# Patient Record
Sex: Female | Born: 1950 | Race: White | Hispanic: No | Marital: Married | Smoking: Never smoker
Health system: Southern US, Community
[De-identification: ages and names within clinical notes are randomized; demographics above are authoritative.]

## PROBLEM LIST (undated history)

## (undated) DIAGNOSIS — Z9889 Other specified postprocedural states: Secondary | ICD-10-CM

## (undated) DIAGNOSIS — R112 Nausea with vomiting, unspecified: Secondary | ICD-10-CM

## (undated) DIAGNOSIS — I10 Essential (primary) hypertension: Secondary | ICD-10-CM

## (undated) DIAGNOSIS — G971 Other reaction to spinal and lumbar puncture: Secondary | ICD-10-CM

## (undated) DIAGNOSIS — R011 Cardiac murmur, unspecified: Secondary | ICD-10-CM

## (undated) DIAGNOSIS — M2550 Pain in unspecified joint: Secondary | ICD-10-CM

## (undated) DIAGNOSIS — K5790 Diverticulosis of intestine, part unspecified, without perforation or abscess without bleeding: Secondary | ICD-10-CM

## (undated) DIAGNOSIS — M2540 Effusion, unspecified joint: Secondary | ICD-10-CM

## (undated) DIAGNOSIS — D649 Anemia, unspecified: Secondary | ICD-10-CM

## (undated) DIAGNOSIS — C801 Malignant (primary) neoplasm, unspecified: Secondary | ICD-10-CM

## (undated) DIAGNOSIS — L409 Psoriasis, unspecified: Secondary | ICD-10-CM

## (undated) DIAGNOSIS — E785 Hyperlipidemia, unspecified: Secondary | ICD-10-CM

## (undated) DIAGNOSIS — R350 Frequency of micturition: Secondary | ICD-10-CM

## (undated) DIAGNOSIS — Z8709 Personal history of other diseases of the respiratory system: Secondary | ICD-10-CM

## (undated) DIAGNOSIS — E039 Hypothyroidism, unspecified: Secondary | ICD-10-CM

## (undated) HISTORY — PX: NEPHRECTOMY: SHX65

## (undated) HISTORY — PX: OTHER SURGICAL HISTORY: SHX169

## (undated) HISTORY — PX: COLONOSCOPY: SHX174

---

## 1998-07-30 ENCOUNTER — Other Ambulatory Visit: Admission: RE | Admit: 1998-07-30 | Discharge: 1998-07-30 | Payer: Self-pay | Admitting: Gynecology

## 1999-07-23 ENCOUNTER — Encounter: Admission: RE | Admit: 1999-07-23 | Discharge: 1999-07-23 | Payer: Self-pay | Admitting: Internal Medicine

## 1999-07-23 ENCOUNTER — Encounter: Payer: Self-pay | Admitting: Internal Medicine

## 1999-07-30 ENCOUNTER — Encounter: Admission: RE | Admit: 1999-07-30 | Discharge: 1999-07-30 | Payer: Self-pay | Admitting: Internal Medicine

## 1999-07-30 ENCOUNTER — Encounter: Payer: Self-pay | Admitting: Internal Medicine

## 1999-09-30 ENCOUNTER — Other Ambulatory Visit: Admission: RE | Admit: 1999-09-30 | Discharge: 1999-09-30 | Payer: Self-pay | Admitting: Gynecology

## 2000-07-06 ENCOUNTER — Ambulatory Visit (HOSPITAL_COMMUNITY): Admission: RE | Admit: 2000-07-06 | Discharge: 2000-07-06 | Payer: Self-pay | Admitting: Gastroenterology

## 2000-07-06 ENCOUNTER — Encounter: Payer: Self-pay | Admitting: Gastroenterology

## 2000-07-16 ENCOUNTER — Encounter: Payer: Self-pay | Admitting: Internal Medicine

## 2000-07-16 ENCOUNTER — Encounter: Admission: RE | Admit: 2000-07-16 | Discharge: 2000-07-16 | Payer: Self-pay | Admitting: Internal Medicine

## 2000-08-02 ENCOUNTER — Encounter: Admission: RE | Admit: 2000-08-02 | Discharge: 2000-08-02 | Payer: Self-pay | Admitting: Internal Medicine

## 2000-08-02 ENCOUNTER — Encounter: Payer: Self-pay | Admitting: Internal Medicine

## 2000-09-30 ENCOUNTER — Other Ambulatory Visit: Admission: RE | Admit: 2000-09-30 | Discharge: 2000-09-30 | Payer: Self-pay | Admitting: Gynecology

## 2001-08-08 ENCOUNTER — Encounter: Payer: Self-pay | Admitting: Internal Medicine

## 2001-08-08 ENCOUNTER — Encounter: Admission: RE | Admit: 2001-08-08 | Discharge: 2001-08-08 | Payer: Self-pay | Admitting: Internal Medicine

## 2002-08-28 ENCOUNTER — Encounter: Admission: RE | Admit: 2002-08-28 | Discharge: 2002-08-28 | Payer: Self-pay | Admitting: Internal Medicine

## 2002-08-28 ENCOUNTER — Encounter: Payer: Self-pay | Admitting: Internal Medicine

## 2003-09-10 ENCOUNTER — Encounter: Admission: RE | Admit: 2003-09-10 | Discharge: 2003-09-10 | Payer: Self-pay | Admitting: Internal Medicine

## 2004-09-17 ENCOUNTER — Encounter: Admission: RE | Admit: 2004-09-17 | Discharge: 2004-09-17 | Payer: Self-pay | Admitting: Internal Medicine

## 2004-09-25 ENCOUNTER — Encounter: Admission: RE | Admit: 2004-09-25 | Discharge: 2004-09-25 | Payer: Self-pay | Admitting: Internal Medicine

## 2005-09-21 ENCOUNTER — Encounter: Admission: RE | Admit: 2005-09-21 | Discharge: 2005-09-21 | Payer: Self-pay | Admitting: Internal Medicine

## 2005-09-28 ENCOUNTER — Encounter: Admission: RE | Admit: 2005-09-28 | Discharge: 2005-09-28 | Payer: Self-pay | Admitting: Internal Medicine

## 2005-10-02 ENCOUNTER — Encounter: Admission: RE | Admit: 2005-10-02 | Discharge: 2005-10-02 | Payer: Self-pay | Admitting: Internal Medicine

## 2005-10-04 ENCOUNTER — Encounter: Admission: RE | Admit: 2005-10-04 | Discharge: 2005-10-04 | Payer: Self-pay | Admitting: Internal Medicine

## 2005-10-26 ENCOUNTER — Inpatient Hospital Stay (HOSPITAL_COMMUNITY): Admission: RE | Admit: 2005-10-26 | Discharge: 2005-10-28 | Payer: Self-pay | Admitting: Urology

## 2005-10-26 ENCOUNTER — Encounter (INDEPENDENT_AMBULATORY_CARE_PROVIDER_SITE_OTHER): Payer: Self-pay | Admitting: *Deleted

## 2006-02-05 ENCOUNTER — Ambulatory Visit (HOSPITAL_COMMUNITY): Admission: RE | Admit: 2006-02-05 | Discharge: 2006-02-05 | Payer: Self-pay | Admitting: Urology

## 2006-08-02 ENCOUNTER — Ambulatory Visit (HOSPITAL_COMMUNITY): Admission: RE | Admit: 2006-08-02 | Discharge: 2006-08-02 | Payer: Self-pay | Admitting: Urology

## 2006-08-06 ENCOUNTER — Ambulatory Visit (HOSPITAL_COMMUNITY): Admission: RE | Admit: 2006-08-06 | Discharge: 2006-08-06 | Payer: Self-pay | Admitting: Urology

## 2006-08-13 ENCOUNTER — Ambulatory Visit (HOSPITAL_COMMUNITY): Admission: RE | Admit: 2006-08-13 | Discharge: 2006-08-13 | Payer: Self-pay | Admitting: Urology

## 2006-09-03 ENCOUNTER — Other Ambulatory Visit: Admission: RE | Admit: 2006-09-03 | Discharge: 2006-09-03 | Payer: Self-pay | Admitting: Internal Medicine

## 2006-09-23 ENCOUNTER — Encounter: Admission: RE | Admit: 2006-09-23 | Discharge: 2006-09-23 | Payer: Self-pay | Admitting: Internal Medicine

## 2006-11-05 ENCOUNTER — Ambulatory Visit (HOSPITAL_COMMUNITY): Admission: RE | Admit: 2006-11-05 | Discharge: 2006-11-05 | Payer: Self-pay | Admitting: Urology

## 2007-01-31 ENCOUNTER — Ambulatory Visit (HOSPITAL_COMMUNITY): Admission: RE | Admit: 2007-01-31 | Discharge: 2007-01-31 | Payer: Self-pay | Admitting: Urology

## 2007-05-24 ENCOUNTER — Ambulatory Visit (HOSPITAL_COMMUNITY): Admission: RE | Admit: 2007-05-24 | Discharge: 2007-05-24 | Payer: Self-pay | Admitting: Urology

## 2007-09-22 ENCOUNTER — Ambulatory Visit (HOSPITAL_COMMUNITY): Admission: RE | Admit: 2007-09-22 | Discharge: 2007-09-22 | Payer: Self-pay | Admitting: Urology

## 2007-09-26 ENCOUNTER — Encounter: Admission: RE | Admit: 2007-09-26 | Discharge: 2007-09-26 | Payer: Self-pay | Admitting: Internal Medicine

## 2008-03-18 ENCOUNTER — Inpatient Hospital Stay (HOSPITAL_COMMUNITY): Admission: EM | Admit: 2008-03-18 | Discharge: 2008-03-19 | Payer: Self-pay | Admitting: Emergency Medicine

## 2008-03-18 ENCOUNTER — Ambulatory Visit: Payer: Self-pay | Admitting: Internal Medicine

## 2008-08-27 ENCOUNTER — Ambulatory Visit (HOSPITAL_COMMUNITY): Admission: RE | Admit: 2008-08-27 | Discharge: 2008-08-27 | Payer: Self-pay | Admitting: Urology

## 2008-09-10 IMAGING — CR DG CHEST 2V
2 series · 2 of 2 positions shown · non-contrast
Comparison: 02/05/06.

CLINICAL DATA: Renal cell carcinoma.
 CHEST - 2 VIEW:

[view not recorded (1 of 2)]
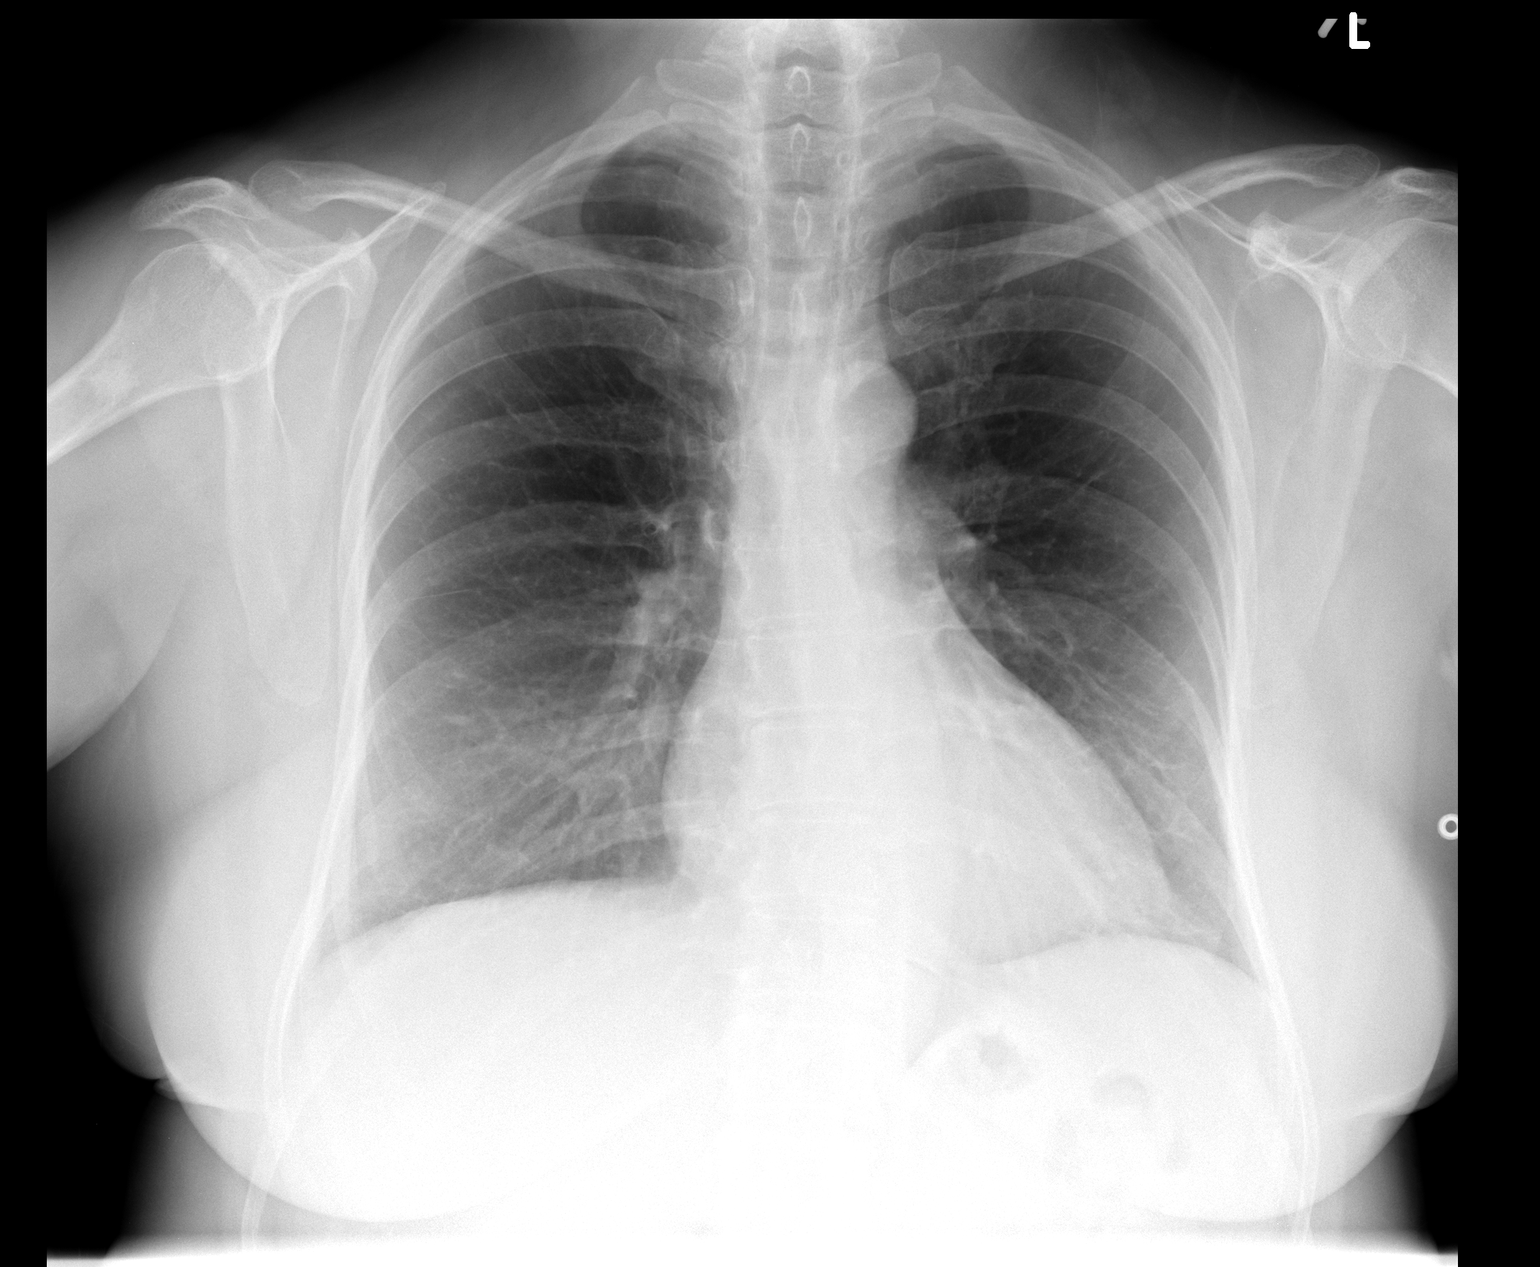

[view not recorded (2 of 2)]
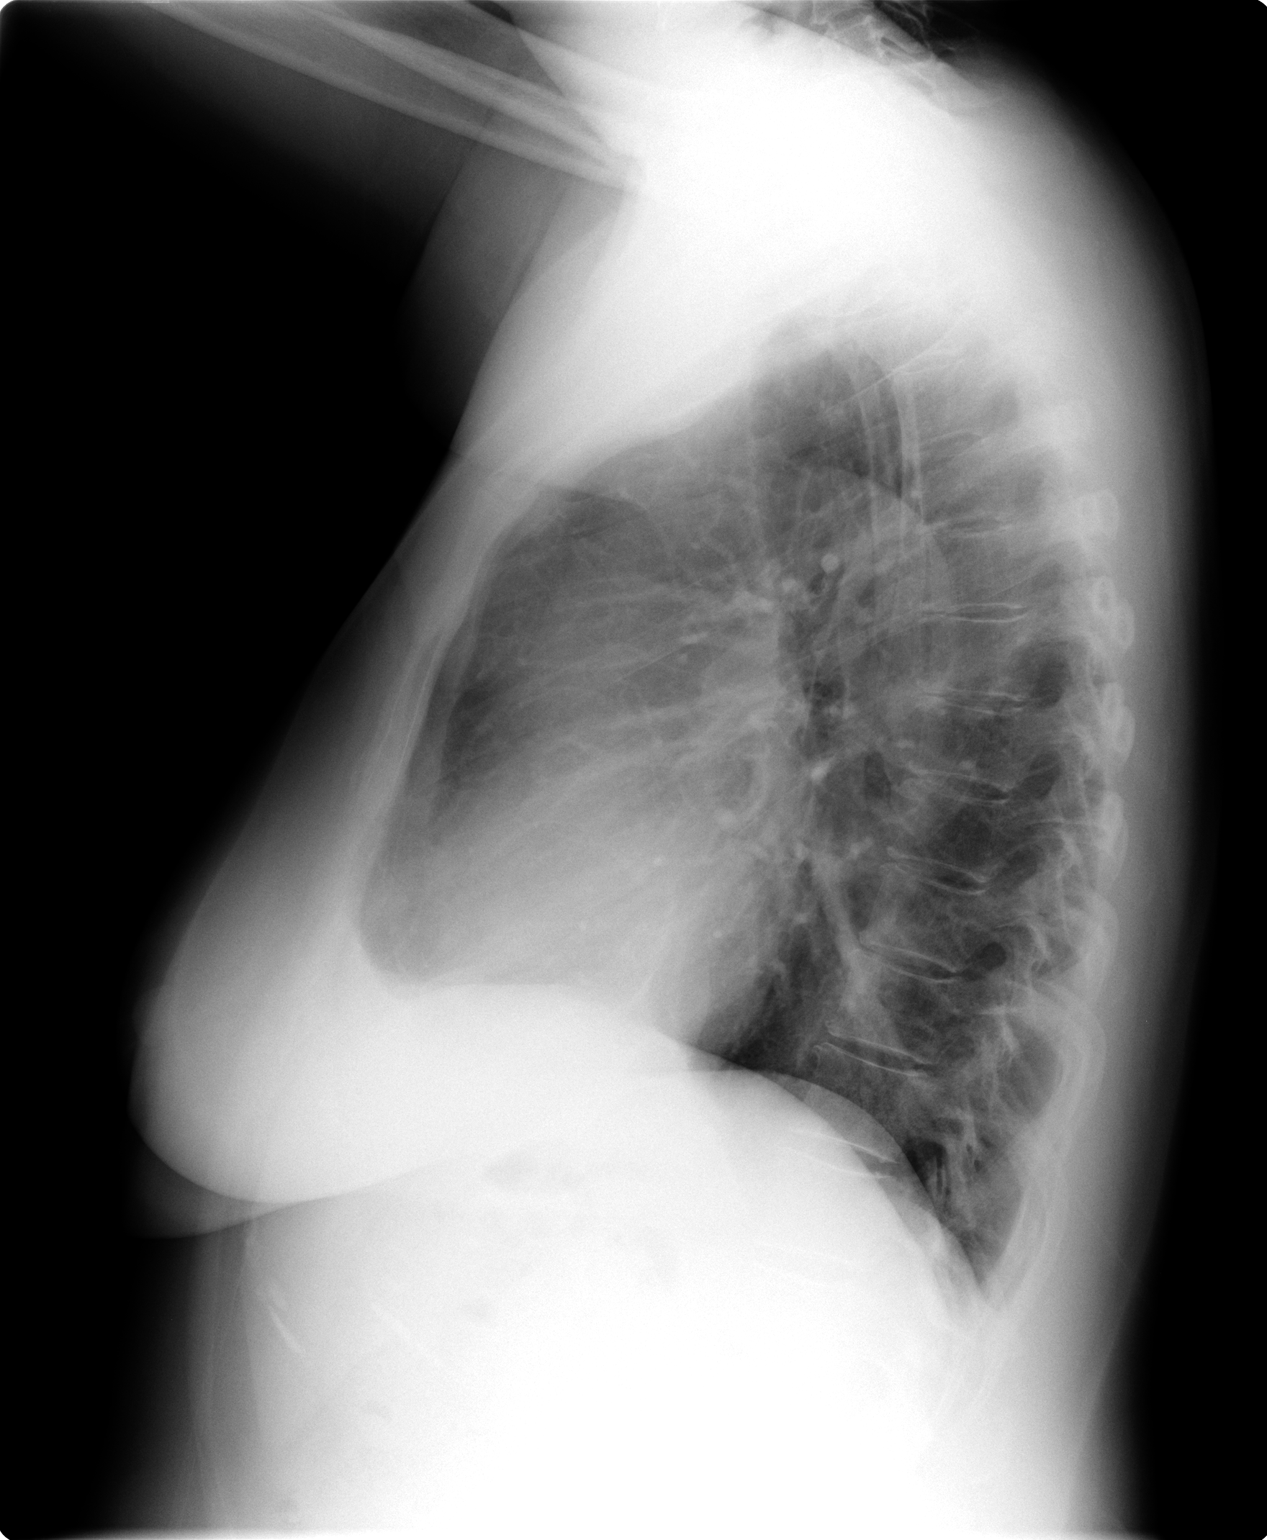

[2 of 2 positions shown; findings below may reference images not displayed]

FINDINGS: The lungs are clear without infiltrate or effusion. No lung nodules are identified. Heart is upper normal in size.
 There is a 1 cm sclerotic lesion in the right proximal humerus which was not imaged on prior studies.  This is probably benign but correlation with symptoms and possibly humerus radiographs is suggested.
IMPRESSION: 1.  Negative for lung nodule. No active cardiopulmonary disease.
 2.  Sclerotic lesion in the right humerus is probably benign but given the history, follow-up radiographs of the right humerus are suggested.

## 2008-09-26 ENCOUNTER — Encounter: Admission: RE | Admit: 2008-09-26 | Discharge: 2008-09-26 | Payer: Self-pay | Admitting: Internal Medicine

## 2009-03-11 ENCOUNTER — Ambulatory Visit (HOSPITAL_COMMUNITY): Admission: RE | Admit: 2009-03-11 | Discharge: 2009-03-11 | Payer: Self-pay | Admitting: Urology

## 2009-07-02 IMAGING — CR DG HUMERUS 2V *R*
2 series · 2 of 2 positions shown · non-contrast
Comparison: none

CLINICAL DATA: Renal cancer. Followup humeral lesion

RIGHT HUMERUS - 2 VIEW

[w humerus ap right *]
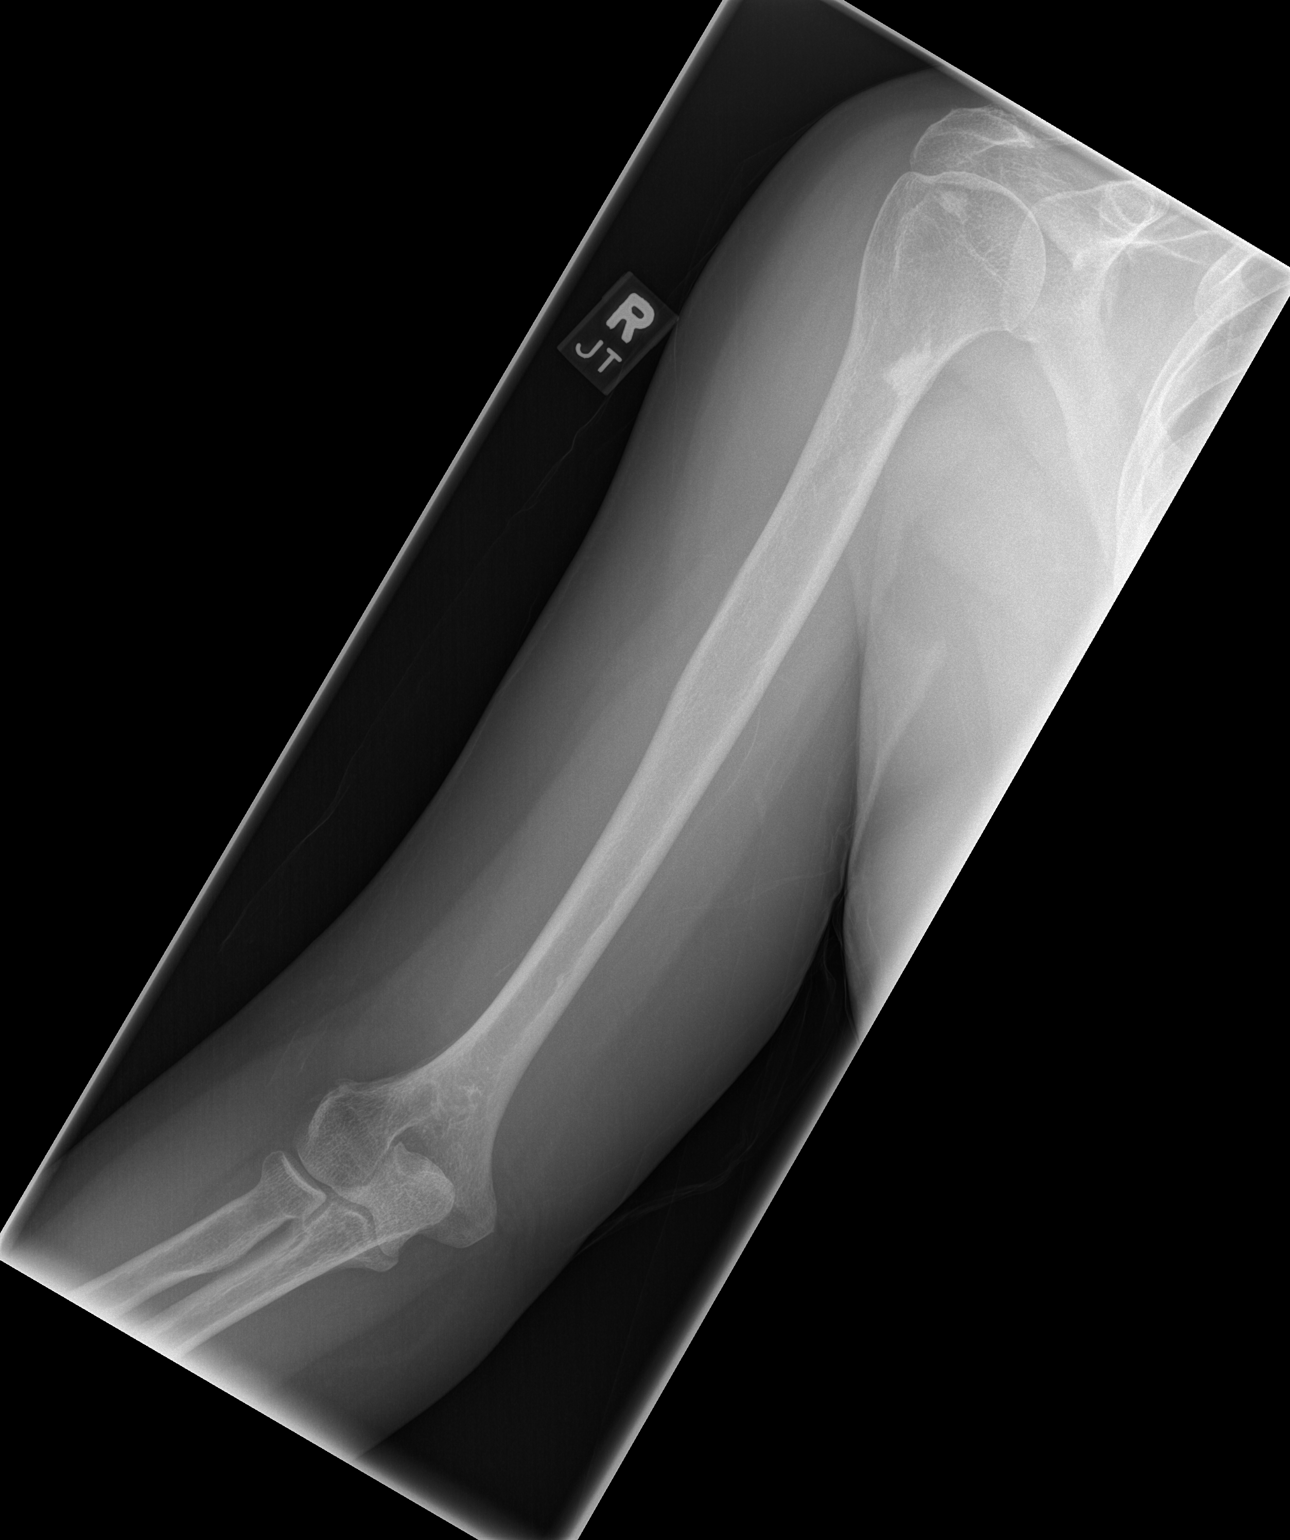

[w humerus lat right *]
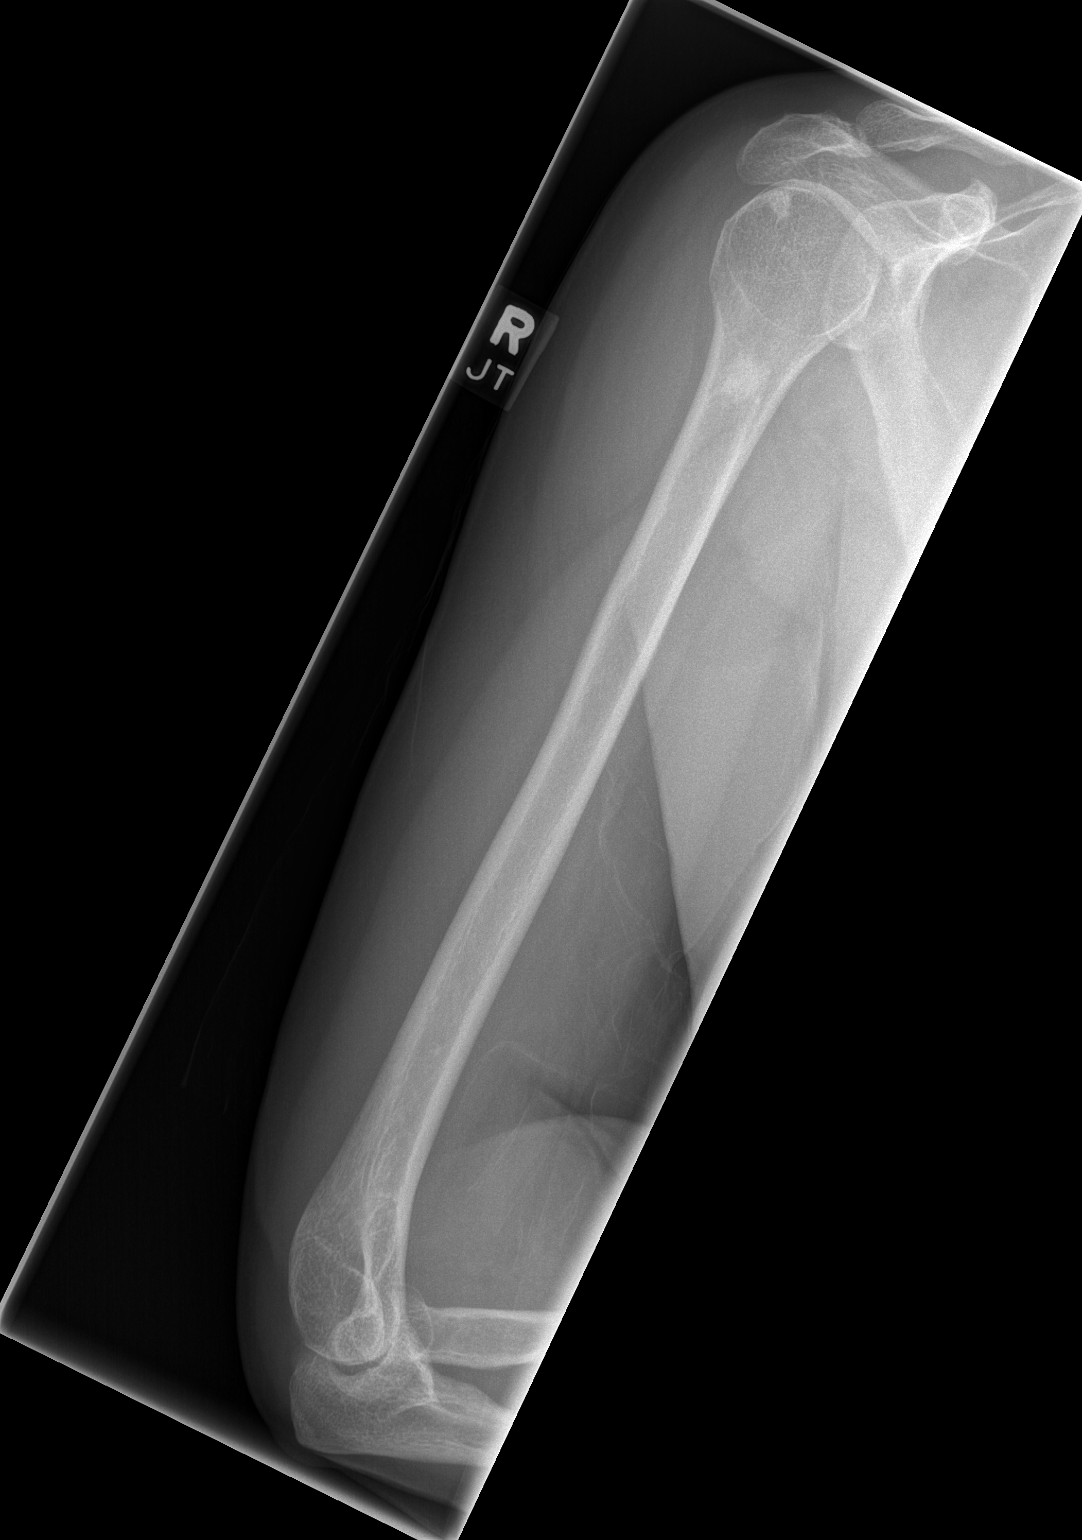

[2 of 2 positions shown; findings below may reference images not displayed]

FINDINGS: Two sclerotic foci are again seen within the proximal right humerus.
These are unchanged in size and appearance. These are also stable dating back to
08/06/2006, and were negative on bone scan on 08/13/2006. Given the stability and
prior negative bone scan, these are most likely bone islands.

IMPRESSION

Two sclerotic foci in the proximal right humerus are stable, most likely bone
islands.

## 2009-07-02 IMAGING — CR DG CHEST 2V
2 series · 2 of 2 positions shown · non-contrast
Comparison: 01/31/2007

CLINICAL DATA: Renal cancer

CHEST - 2 VIEW:

[view not recorded (1 of 2)]
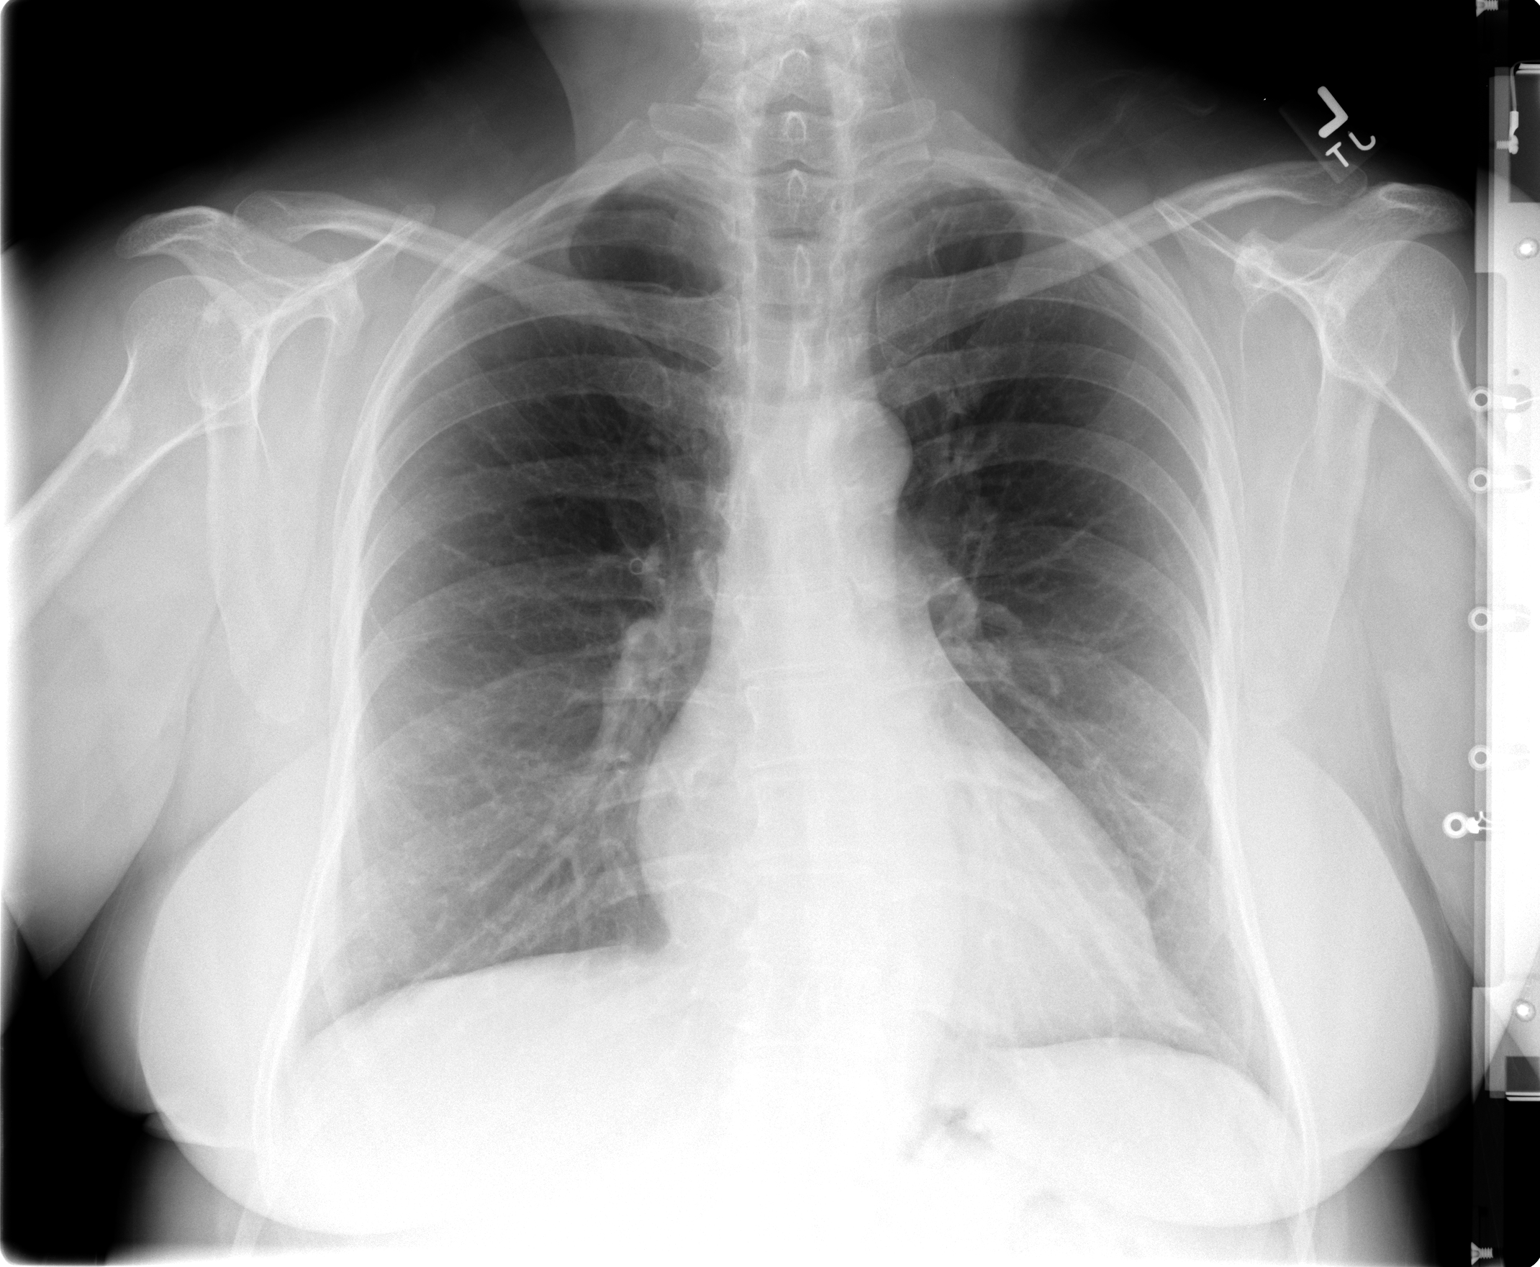

[view not recorded (2 of 2)]
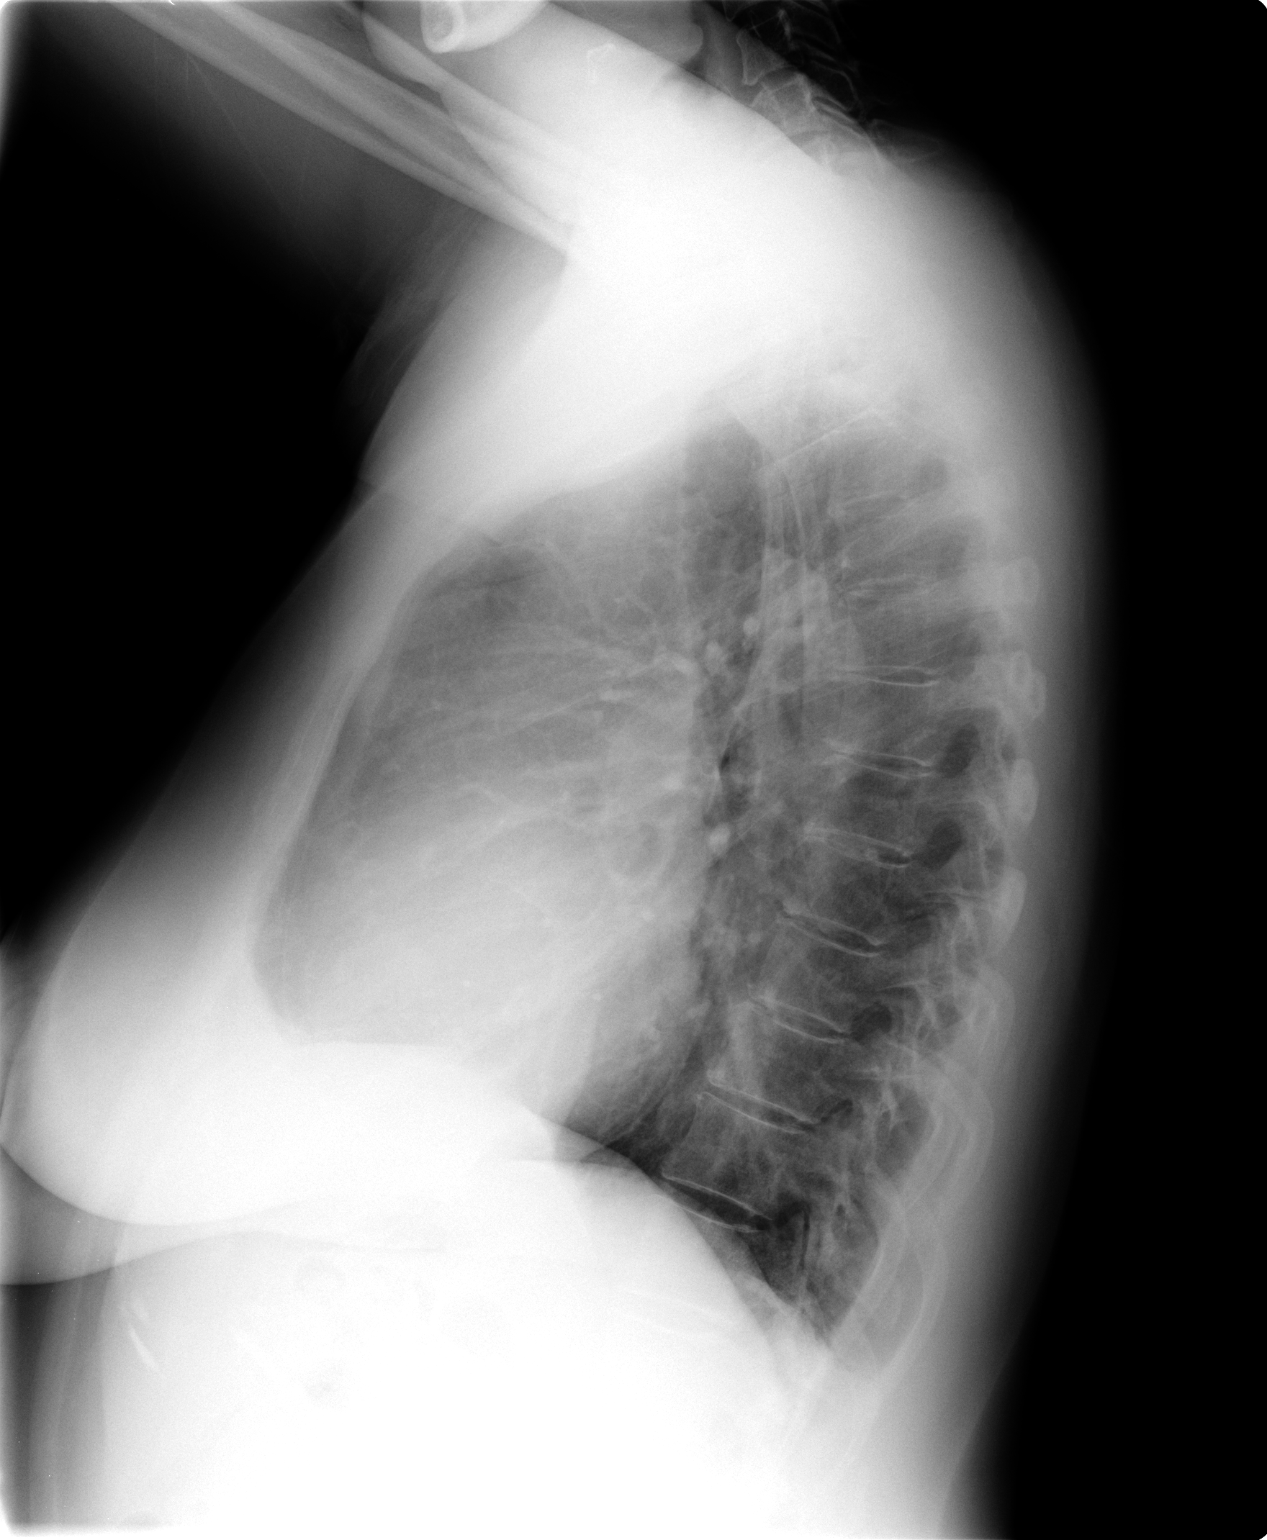

[2 of 2 positions shown; findings below may reference images not displayed]

FINDINGS: Heart is upper limits normal in size. No focal opacities or
effusions. No acute bony abnormality.
IMPRESSION: No active disease.

## 2009-09-27 ENCOUNTER — Encounter: Admission: RE | Admit: 2009-09-27 | Discharge: 2009-09-27 | Payer: Self-pay | Admitting: Internal Medicine

## 2009-11-29 ENCOUNTER — Ambulatory Visit (HOSPITAL_COMMUNITY): Admission: RE | Admit: 2009-11-29 | Discharge: 2009-11-29 | Payer: Self-pay | Admitting: Urology

## 2010-06-19 ENCOUNTER — Other Ambulatory Visit: Payer: Self-pay | Admitting: Dermatology

## 2010-08-05 NOTE — Discharge Summary (Signed)
Melissa Henry, Melissa Henry              ACCOUNT NO.:  [id number]   MEDICAL RECORD NO.:  [id number]          PATIENT TYPE:  INP   LOCATION:  5506                         FACILITY:  [facility]   PHYSICIAN:  Theressa Millard, M.D.    DATE OF BIRTH:  [birth year]   DATE OF ADMISSION:  03/18/2008  DATE OF DISCHARGE:  03/19/2008                               DISCHARGE SUMMARY   ADMITTING DIAGNOSIS:  Chest pain.   DISCHARGE DIAGNOSES:  1. Chest pain, myocardial infarction, ruled out, no evidence of      coronary artery disease on catheterization.  2. Hypertension.  3. Hypothyroidism.  4. Psoriasis.  5. History of heart murmur.  6. Renal cell carcinoma, status post nephrectomy.  7. Hyperlipidemia.   The patient is a 60 year old white female admitted on March 19, 2007,  with chest discomfort, which was somewhat atypical in nature, but was  relieved with nitroglycerin.   HOSPITAL COURSE:  The patient was admitted on the base of serial enzymes  and myocardial infarction was ruled out.  She was seen in consultation  by Dr. Eldridge Dace.  At that time, she was having recurrent pain and  although it was felt that the patient likely had normal coronary  arteries, a heart catheterization was undertaken as the differential  diagnosis would be of a noncardiac source versus serious cardiac  disease.  Fortunately, cardiac catheterization was normal.  She had a  vascular seal placed in the cath site and she was observed for several  hours and she was discharged in improved condition.   She will obtain some omeprazole 20 mg once daily to take for the next 3-  4 weeks prior to her followup appointment with Dr. Nehemiah Settle.   DISCHARGE MEDICATIONS:  1. Dilacor XR 240 mg daily.  2. Hydrochlorothiazide 25 mg daily.  3. Lipitor 10 mg daily.  4. Methotrexate 2.5 mg as directed.  5. Synthroid 125 mcg daily.  6. Omeprazole 20 mg daily.   FOLLOWUP:  She will call to make an appointment to see Dr. Nehemiah Settle in 2-3   weeks.   ACTIVITIES:  As tolerated.      Theressa Millard, M.D.  Electronically Signed     JO/MEDQ  D:  03/19/2008  T:  03/20/2008  Job:  [id number]

## 2010-08-05 NOTE — Cardiovascular Report (Signed)
NAMECORYNN, Melissa Henry              ACCOUNT NO.:  [id number]   MEDICAL RECORD NO.:  [id number]          PATIENT TYPE:  INP   LOCATION:  5506                         FACILITY:  [facility]   PHYSICIAN:  Corky Crafts, MDDATE OF BIRTH:  08/22/1950   DATE OF PROCEDURE:  DATE OF DISCHARGE:  03/19/2008                            CARDIAC CATHETERIZATION   REFERRING PHYSICIAN:  Deirdre Peer. Polite, MD   PROCEDURES PERFORMED:  Left heart catheterization, left ventriculogram,  coronary angiogram, and abdominal aortogram.   OPERATOR:  Corky Crafts, MD   INDICATIONS:  Persistent chest pain.   PROCEDURE NARRATIVE:  The risks and benefits of cardiac catheterization  were explained to the patient and informed consent was obtained.  She  was brought to the cath lab.  She was prepped and draped in the usual  sterile fashion.  Her right groin was infiltrated with 1% lidocaine.  A  5-French sheath was placed into the right femoral artery using the  modified Seldinger technique.  Left coronary artery angiography was  performed using a JL4 pigtail catheter.  The catheter was advanced to  the vessel ostium under fluoroscopic guidance.  Digital angiography was  performed in multiple projections using hand injection of contrast.  The  right coronary artery angiography was performed using a JR4 pigtail  catheter.  The catheter was advanced to the vessel ostium under  fluoroscopic guidance.  Digital angiography was performed in multiple  projections using hand injection of contrast.  A pigtail catheter was  advanced to the ascending aorta and across the aortic valve under  fluoroscopic guidance.  The catheter was used for a power injection in  the RAO position to image the left ventricle.  The catheter was pulled  back under continuous hemodynamic pressure monitoring.  The catheter was  then withdrawn to the abdominal aorta.  A power injection of contrast  was performed in the AP projection.  The  sheath was removed and an Angio-  Seal was placed for hemostasis.   FINDINGS:  The left main was widely patent.  The left circumflex was a  tortuous vessel which had mild luminal irregularities.  There is an OM1  which is a large vessel that had only mild irregularities.   The left anterior descending was a large vessel proximally and medium  size in the midportion.  It reached the apex.  There were mild  irregularities in the proximal portion.  There was a very small diagonal  which was patent.   The right coronary artery was a large dominant vessel which appeared  widely patent.  The posterior descending artery was small,  posterolateral artery was a large vessel.   Left ventriculogram showed normal left ventricular function.   HEMODYNAMIC RESULTS:  Left ventricular pressure 121/0 with an LVEDP of 4  mmHg.  Aortic pressure of 117/62 with a mean aortic pressure of 85 mmHg.  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  She is status  post nephrectomy.  The right renal artery was widely patent.  There is  no abdominal aortic aneurysm.   IMPRESSION:  1. No hemodynamically significant  coronary artery disease.  2. Normal left ventricular function.  3. No abdominal aortic aneurysm.   RECOMMENDATIONS:  Given that there is no significant coronary artery  disease, continue aggressive preventive therapy including blood pressure  and lipid control.  She should exercise regularly.  After her bedrest  and IV fluids, she can be discharged from a cardiac standpoint.  Other  etiologies of her chest pain can be looked into.      Corky Crafts, MD  Electronically Signed     JSV/MEDQ  D:  03/19/2008  T:  03/19/2008  Job:  [id number]

## 2010-08-05 NOTE — H&P (Signed)
Melissa Henry, Melissa Henry              ACCOUNT NO.:  [id number]   MEDICAL RECORD NO.:  [id number]          PATIENT TYPE:  INP   LOCATION:  5506                         FACILITY:  [facility]   PHYSICIAN:  Gardiner Barefoot, MD    DATE OF BIRTH:  06-08-1950   DATE OF ADMISSION:  03/18/2008  DATE OF DISCHARGE:                              HISTORY & PHYSICAL   PRIMARY CARE PHYSICIAN:  Deirdre Peer. Polite, M.D.   CHIEF COMPLAINT:  Chest pain.   HISTORY OF PRESENT ILLNESS:  This is a 60 year old female with the  history of hypertension who presents with the left-sided chest dull  pain, that happened at 5 o' clock this evening.  The patient reports she  has had those episodes a few times before.  They usually resolve  spontaneously within a few minutes; however, this one persisted.  The  patient came into the emergency room approximately 7 p.m. and did get  relief after 2 sublingual nitros.  The patient reports some shoulder  pain associated with radiation to the shoulder associated with these and  denies any nausea or diaphoresis.  The patient otherwise denies any  fever or other symptoms.   PAST MEDICAL HISTORY:  1. Hypothyroidism.  2. Hypertension.  3. Psoriasis.  4. History of the heart murmur.  5. Renal cell carcinoma.  6. Status post left nephrectomy.  7. Hyperlipidemia.   MEDICATIONS:  1. Dilacor XR 240 mg daily.  2. Hydrochlorothiazide 25 mg daily.  3. Lipitor 10 mg daily.  4. Methotrexate 2.5 mg 2 tablets every Friday 2 in the morning and 2      in the afternoon.  5. Synthroid 125 mcg p.o. daily.   ALLERGIES:  No known drug allergies.   SOCIAL HISTORY:  The patient denies any history of alcohol, tobacco, or  drugs.   FAMILY HISTORY:  Mother with CHF.   REVIEW OF SYSTEMS:  Negative except as per the history of present  illness.   PHYSICAL EXAMINATION:  VITAL SIGNS:  Temperature is 97.4, pulse is 83,  respirations 18, blood pressure is 125/72, and O2 sats 100%.  GENERAL:  The  patient is awake, alert, and oriented x3, pleasant and in  no acute distress.  CARDIOVASCULAR:  Regular rate and rhythm.  No murmurs, rubs, or gallops.  LUNGS:  Clear to auscultation bilaterally.  ABDOMEN:  Abdomen is soft, nontender, and nondistended.  Positive bowel  sounds.  No hepatosplenomegaly.  EXTREMITIES:  No cyanosis, clubbing, or edema.   LABORATORY DATA:  An EKG with sinus tach, CK-MB is less than 1, troponin  is less than 0.05, D-dimer is 0.30.  Sodium 138, potassium 3.8, chloride  104, bicarb 27, glucose 100, BUN 12, and creatinine 2.83.  WBC 5.7,  platelets 260, and hemoglobin 11.1.  Chest x-ray, no acute disease.   ASSESSMENT:  1. Chest pain.  Will admit the patient to rule out myocardial      infarction on telemetry.  The patient may benefit from stress test.  2. Cardiovascular.  Will continue the patient's home medications.  3. Hypothyroidism.  Will continue with Synthroid.  4.  Psoriasis.  Will continue with methotrexate, which is every Friday      if she remains in the hospital.  5. Prophylaxis.  Will start deep vein thrombosis prophylaxis.      Gardiner Barefoot, MD  Electronically Signed     RWC/MEDQ  D:  03/18/2008  T:  03/19/2008  Job:  [id number]

## 2010-08-08 NOTE — Discharge Summary (Signed)
Melissa Henry, Melissa Henry              ACCOUNT NO.:  [id number]   MEDICAL RECORD NO.:  [id number]          PATIENT TYPE:  INP   LOCATION:  1404                         FACILITY:  [facility]   PHYSICIAN:  Bertram Millard. Dahlstedt, M.D.DATE OF BIRTH:  March 05, 1951   DATE OF ADMISSION:  10/26/2005  DATE OF DISCHARGE:                                 DISCHARGE SUMMARY   PRIMARY DIAGNOSIS:  1. 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  2. Hypothyroidism.  3. Hypertension.  4. Psoriasis.   PRINCIPAL PROCEDURE:  October 26, 2005, left laparoscopic nephrectomy with  hand assist.   BRIEF HISTORY:  This 60 year old lady is admitted for a left nephrectomy.  She was recently diagnosed with a left renal mass during virtual colonoscopy  last month.  This report revealed a normal colon but 4-5 cm left renal mass  which enhanced with IV contrast.  MRI and ultrasound confirmed the renal  mass.  She has had a negative metastatic survey and presents at this time  for surgery.   HOSPITAL COURSE:  The patient was admitted directly to the operating room.  She underwent a left laparoscopic nephrectomy with hand assist.  She  tolerated the procedure well.  Postoperatively, the course was unremarkable.  She was advanced to a regular diet without difficulty.  She was changed to  oral pain medicine after the first 24 hours.  She ambulated.  She did not  have significant bowel movement but passed a little bit of gas.  The  abdominal exam revealed adequate healing of incisions.  In the evening on  August 8, she was felt to have reached maximal hospital benefit.  She was  discharged at that time.   DISCHARGE MEDICATIONS:  Percocet 5/325, 1-2 p.o. q.4h. p.r.n. pain, (#25),  Synthroid 100 mcg daily, hydrochlorothiazide 25 mg 1/2 p.o. daily,  methotrexate 2.5 mg, 2 p.o. b.i.d. every Friday, diltiazem XR 240 mg daily,  folic acid 1 mg p.o. every Friday.  She was also told to start back on her  baby  aspirin.   DISCHARGE INSTRUCTIONS:  She will follow up with me in approximately one  week for staple removal.  She was instructed on activity restrictions.      Bertram Millard. Dahlstedt, M.D.  Electronically Signed     SMD/MEDQ  D:  10/28/2005  T:  10/28/2005  Job:  [id number]   cc:   Ladell Pier, M.D.  Fax: [phone]

## 2010-08-08 NOTE — H&P (Signed)
Melissa Henry, Melissa Henry              ACCOUNT NO.:  [id number]   MEDICAL RECORD NO.:  [id number]          PATIENT TYPE:  INP   LOCATION:  0002                         FACILITY:  [facility]   PHYSICIAN:  Bertram Millard. Dahlstedt, M.D.DATE OF BIRTH:  [birth year]   DATE OF ADMISSION:  10/26/2005  DATE OF DISCHARGE:                                HISTORY & PHYSICAL   REASON FOR ADMISSION:  Left renal mass.   BRIEF HISTORY:  The patient is a 60 year old lady, who is referred by Ladell Pier, M.D. for left renal mass.  She had a virtual colonoscopy in early  July.  It revealed a normal colon but a 4 to 5 cm left renal mass.  Follow-  up ultrasound and MRI confirmed this.  There is no evidence of metastatic  disease.   The patient comes in at this time for left radical nephrectomy.  It was felt  that radical nephrectomy is necessary as the patient's mass goes up into the  hilum.  She has not had a history of any gross hematuria and urinalysis in  the office was clear.  It is felt that this is a renal cell carcinoma and  she presents at this time for minimally invasive left radical nephrectomy.   PAST MEDICAL HISTORY:  Significant for hypothyroidism, hypertension and  psoriasis.  She also has a heart murmur.   PAST SURGICAL HISTORY:  Significant for cesarean delivery x2, in 1976 and  1983.  She has had a tendon repair in 1992 and breast biopsy x2.   MEDICATIONS:  Synthroid 100 mcg daily, hydrochlorothiazide 25 mg 1/2 a day,  methotrexate 2.5 mg 2 by mouth twice daily every Friday, Diltiazem XR 240 mg  daily, folic acid 1 mg by mouth once a week and amoxicillin p.r.n.  She  takes a baby aspirin as well.  She stopped that a few days ago.   She denies any drug allergies.   FAMILY HISTORY:  Significant for stroke, kidney stones, diabetes, heart  disease, hypertension, Alzheimer's disease and lung cancer.   The patient is a native of the [place] part [place] the [place].  She does not use  any alcohol or  tobacco.  She is married and has two grown children.  She is  a Futures trader.   REVIEW OF SYSTEMS:  Negative.   PHYSICAL EXAMINATION:  GENERAL:  Pleasant, healthy-appearing, slightly obese  female.  VITAL SIGNS:  Blood pressure 156/92, pulse 84, temperature 98.6.  NECK:  Supple.  No supraclavicular or cervical adenopathy.  LUNGS:  Clear.  HEART:  Normal rate and rhythm.  ABDOMEN:  Mildly protuberant, soft, nondistended, nontender.  No mass, no  megaly.  Bladder nonpalpable.  No CVA tenderness, no flank mass.  EXTREMITIES:  Normal.  She had no peripheral edema.  She had good distal  pulses in both feet.   ADMISSION LABORATORY DATA:  Hemoglobin and hematocrit were normal, BMET was  normal.  Urinalysis was clear.  She had a type and screen set up.  Chest x-  ray and EKG were normal except for some mid bronchitic changes on the chest  x-ray.   IMPRESSION:  1.  Left renal mass, probable renal cell carcinoma.  2.  Hypertension.  3.  History of psoriasis.  4.  History of hypothyroidism.   PLAN:  Admit for left laparoscopic nephrectomy.      Bertram Millard. Dahlstedt, M.D.  Electronically Signed     SMD/MEDQ  D:  10/26/2005  T:  10/26/2005  Job:  [id number]

## 2010-08-08 NOTE — Op Note (Signed)
NAMELILLYONNA, ARMSTEAD              ACCOUNT NO.:  [id number]   MEDICAL RECORD NO.:  [id number]          PATIENT TYPE:  INP   LOCATION:  0002                         [facility]ACILITY:  [facility]   PHYSICIAN:  Bertram Millard. Dahlstedt, M.D.DATE OF BIRTH:  Aug 11, 1950   DATE OF PROCEDURE:  10/26/2005  DATE OF DISCHARGE:                                 OPERATIVE REPORT   PREOPERATIVE DIAGNOSIS:  Left renal mass.   POSTOPERATIVE DIAGNOSIS:  Left renal mass.   PRINCIPAL PROCEDURE:  Left radical nephrectomy, laparoscopic with hand  assist.   SURGEON:  Bertram Millard. Dahlstedt, M.D.   FIRST ASSISTANT:  Valetta Fuller, M.D.   ANESTHESIA:  General endotracheal.   ESTIMATED BLOOD LOSS:  100 mL.   COMPLICATIONS:  None.   SPECIMEN:  Left kidney.   BRIEF HISTORY:  A 60 year old female who presents at this time for left  radical nephrectomy.  She had a left renal mass, 4.5 cm in circumference,  diagnosed on a virtual colonoscopy.  This was confirmed with an ultrasound  and an MRI.  This is not an exophytic lesion but extends towards the renal  hilum.  This is felt to be a renal cell carcinoma, as it is not associated  with any gross hematuria.  She presents at this time for left radical  nephrectomy, with her having a history of a negative metastatic survey.  She  is aware of risks and complications of the procedure including, but not  limited to, blood loss, need for transfusion, injury to surrounding organs,  infection, DVT, PE, needing to convert to an open procedure.  She  understands these and agrees to proceed.   DESCRIPTION OF PROCEDURE:  The patient was administered preoperative IV  antibiotics in the holding area, where she was identified and the surgical  side marked.  She was taken to the operating room where a general anesthetic  was administered.  Foley catheter was placed in her bladder, she was placed  in the left flank up position, all extremities padded appropriately.  Her  abdomen was  prepped and draped.  The hand port was then made 6 cm in length,  extending 3 cm above and 3 cm below the umbilicus.  This incision was  carried through into the peritoneum.  There were no significant peritoneal  adhesions.  The hand port was then placed.  Separate operative ports, 10 mm,  were placed in the left lower quadrant just lateral to the rectus muscle in  the midline, approximately 3-4 cm below the xiphisternum.  These were placed  under direct vision.  Pneumoperitoneum was established.  The harmonic  scalpel was then used to divide the left colon as well as the hepatic  flexure, first starting at the white line of Toldt and then freeing up the  splenocolic ligaments.  This allowed the colon to be reflected medially.  This was a fairly easy process.  Gerota's fascia was then identified.  There  was a small splenic capsular tear which occurred anteriorly on the spleen.  This was quite small, perhaps 2 mm wide and 1 cm long.  This  was later  controlled with FloSeal.  Once the colon was reflected medially, dissection  was begun inferiorly on the lower pole of the kidney.  The harmonic scalpel  was used to dissect the perinephric tissue, such that the ureter was  identified.  It was doubly clipped and then divided.  Dissection was then  carried in a superior manner medially such that the renal vein was  identified.  The artery was identified actually just superior to this.  Using careful dissection, the renal vein was skeletonized.  The gonadal vein  was actually never identified.  The adrenal vein was identified quite  lateral.  This was not clipped.  The renal artery was actually quite  anterior and quite superior.  It was easily identified, and then  skeletonized.  The right angle was then used to circumferentially dissect  the renal artery.  It was then doubly clipped medially and singly clipped  laterally.  At this point, the renal vein seemed to flatten out.  The renal  artery  was divided.  Dissection was carried out superiorly such that the  adrenal was left in place.  The superior part of the kidney was basically  dissected free, I then doubly clipped the vein medially and singly clipped  it laterally.  The vein was then divided.  No other arterial or venous  structures were encountered.  Dissection was then carried posterior to the  kidney.  This freed up the entire kidney.  Superior attachments were taken  care of with the harmonic scalpel.  A few fatty adhesions were taken care of  laterally, and the kidney was then totally freed up and brought down to the  pelvis.  The renal fossa was then identified.  No bleeding was seen.  The  hilar vessels were all inspected and found to be hemostatic.  FloSeal, at  this point, was then placed on the small splenic tear.  Surgicel was placed  over top of it.  It seemed to be hemostatic at this point.  The kidney was  then carefully extracted through the midline incision.  The 10-mm trocar  sites were closed with skin staples.  Fascial sutures were not put in.  The  midline incision was closed with a running #1 PDS placed in a simple running  fashion.  The midline incision was then closed with staples.  Dry sterile  dressings were placed.   The patient tolerated procedure well.  Estimated blood loss was under 100  mL.  The specimen went to pathology.  Sponge, needle, and instrument counts  were correct times two.      Bertram Millard. Dahlstedt, M.D.  Electronically Signed     SMD/MEDQ  D:  10/26/2005  T:  10/26/2005  Job:  [id number]   cc:   Ladell Pier, M.D.  Fax: [phone]

## 2010-08-08 NOTE — Consult Note (Signed)
NAMEKEMPER, HOCHMAN              ACCOUNT NO.:  [id number]   MEDICAL RECORD NO.:  [id number]          PATIENT TYPE:  INP   LOCATION:  5506                         FACILITY:  [facility]   PHYSICIAN:  Corky Crafts, MDDATE OF BIRTH:  30-Mar-1950   DATE OF CONSULTATION:  DATE OF DISCHARGE:  03/19/2008                                 CONSULTATION   CHIEF COMPLAINT:  Chest pain.   Ms. Melissa Henry is a 60 year old female with a history of stress test about  10 years ago and this was negative per the patient.  She was admitted  after 2 hours of substernal chest pain last evening.  She had pressure  at rest.  She came to the emergency room where 2 sublingual  nitroglycerin helped.  She admits to chest pain lasting seconds for  years, but 2 weeks ago, had substernal chest pain while walking fast and  then when she rested it went away.  She has had progressive angina since  that time.   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  She had no radiation or chemotherapy treatment.   SOCIAL HISTORY:  She lives in [place].  She is married.  No tobacco,  alcohol, or illicit drug use.   FAMILY HISTORY:  Mother had CHF.  Father had Alzheimer's and thyroid  problems.  One brother had valve disease.  One had a stroke.  Her  grandfather died suddenly in his 9s from a myocardial infarction.   ALLERGIES:  No known drug allergies.   MEDICATIONS:  1. Baby aspirin 81 mg a day.  2. Diltiazem 240 mg a day.  3. Lovenox subcu daily.  4. Hydrochlorothiazide 25 mg a day.  5. Synthroid 125 mcg a day.  6. Zocor 20 mg a day.   PHYSICAL EXAMINATION:  VITAL SIGNS:  Temperature 97.1, pulse 84,  respirations 19, blood pressure 136/77, and O2 saturations 94% on room  air.  GENERAL:  She is in no acute distress.  HEENT:  Grossly normal.  No carotid or subclavian bruits.  No JVD or  thyromegaly.  Sclerae clear.   Conjunctivae normal.  Nares without  drainage.  CHEST:  Clear to auscultation bilaterally.  No wheezing or rhonchi.  HEART:  Regular rate and rhythm with a soft 1/6 systolic murmur.  ABDOMEN:  Good bowel sounds, nontender, nondistended, no mass, no  bruits.  LOWER EXTREMITIES:  No peripheral edema.  SKIN:  Warm and dry.  NEUROLOGIC:  Cranial nerves II-XII are grossly intact.  Normal mood and  affect.   Chest x-ray, no active disease.   LABORATORY STUDIES:  Hemoglobin 11.3, hematocrit 34.4, white count 4.7,  and platelets 249.  Sodium 141, potassium 3.8, BUN 9, creatinine 0.75.  Point-of-care markers negative x3.  D-dimer 0.30.   EKG normal sinus rhythm in 70s.  No acute change.   ASSESSMENT AND PLAN:  1. Unstable angina.  2. Hypertension.  3. Hyperlipidemia.  4. Hypothyroidism.   The patient has been having pain while I have been talking to her, but  sublingual nitroglycerin has relieved her  pain.  We have discussed  cardiac catheterization with her and she has agreed to proceed knowing  the risks and benefits of the procedure.  The patient was seen and  examined by Dr. Vonna Kotyk. [place].      Guy Franco, P.A.      Corky Crafts, MD  Electronically Signed    LB/MEDQ  D:  03/21/2008  T:  03/22/2008  Job:  [id number]

## 2010-08-28 ENCOUNTER — Other Ambulatory Visit: Payer: Self-pay | Admitting: Internal Medicine

## 2010-08-28 DIAGNOSIS — Z1231 Encounter for screening mammogram for malignant neoplasm of breast: Secondary | ICD-10-CM

## 2010-09-29 ENCOUNTER — Ambulatory Visit
Admission: RE | Admit: 2010-09-29 | Discharge: 2010-09-29 | Disposition: A | Discharge status: Home / Self Care | Payer: BC Managed Care – PPO | Source: Ambulatory Visit | Attending: Internal Medicine | Admitting: Internal Medicine

## 2010-09-29 DIAGNOSIS — Z1231 Encounter for screening mammogram for malignant neoplasm of breast: Secondary | ICD-10-CM

## 2010-10-08 ENCOUNTER — Other Ambulatory Visit: Payer: Self-pay | Admitting: Internal Medicine

## 2010-10-08 DIAGNOSIS — Z0000 Encounter for general adult medical examination without abnormal findings: Secondary | ICD-10-CM

## 2010-10-15 ENCOUNTER — Ambulatory Visit
Admission: RE | Admit: 2010-10-15 | Discharge: 2010-10-15 | Disposition: A | Discharge status: Home / Self Care | Payer: Self-pay | Source: Ambulatory Visit | Attending: Internal Medicine | Admitting: Internal Medicine

## 2010-10-15 DIAGNOSIS — Z0000 Encounter for general adult medical examination without abnormal findings: Secondary | ICD-10-CM

## 2010-10-17 ENCOUNTER — Other Ambulatory Visit: Payer: Self-pay | Admitting: Dermatology

## 2010-12-26 LAB — COMPREHENSIVE METABOLIC PANEL
Albumin: 3.4 g/dL — ABNORMAL LOW (ref 3.5–5.2)
BUN: 9 mg/dL (ref 6–23)
Chloride: 106 mEq/L (ref 96–112)
Creatinine, Ser: 0.75 mg/dL (ref 0.4–1.2)
GFR calc non Af Amer: >60 mL/min (ref >60)
Total Bilirubin: 1 mg/dL (ref 0.3–1.2)

## 2010-12-26 LAB — BASIC METABOLIC PANEL
CO2: 27 mEq/L (ref 19–32)
Calcium: 9.2 mg/dL (ref 8.4–10.5)
Creatinine, Ser: 0.83 mg/dL (ref 0.4–1.2)
GFR calc Af Amer: >60 mL/min (ref >60)
GFR calc non Af Amer: >60 mL/min (ref >60)
Glucose, Bld: 100 mg/dL — ABNORMAL HIGH (ref 70–99)

## 2010-12-26 LAB — DIFFERENTIAL
Basophils Absolute: 0 10*3/uL (ref 0.0–0.1)
Basophils Absolute: 0 10*3/uL (ref 0.0–0.1)
Basophils Relative: 1 % (ref 0–1)
Lymphocytes Relative: 29 % (ref 12–46)
Monocytes Absolute: 0.3 10*3/uL (ref 0.1–1.0)
Neutro Abs: 2.7 10*3/uL (ref 1.7–7.7)
Neutro Abs: 3.5 10*3/uL (ref 1.7–7.7)
Neutrophils Relative %: 58 % (ref 43–77)
Neutrophils Relative %: 62 % (ref 43–77)

## 2010-12-26 LAB — CBC
HCT: 34.4 % — ABNORMAL LOW (ref 36.0–46.0)
MCHC: 32.6 g/dL (ref 30.0–36.0)
MCV: 82.6 fL (ref 78.0–100.0)
Platelets: 249 10*3/uL (ref 150–400)
RDW: 17.8 % — ABNORMAL HIGH (ref 11.5–15.5)
WBC: 4.7 10*3/uL (ref 4.0–10.5)

## 2010-12-26 LAB — URINALYSIS, ROUTINE W REFLEX MICROSCOPIC
Nitrite: NEGATIVE
Protein, ur: NEGATIVE mg/dL
Urobilinogen, UA: 1 mg/dL (ref 0.0–1.0)

## 2010-12-26 LAB — POCT CARDIAC MARKERS
Myoglobin, poc: 59.3 ng/mL (ref 12–200)
Myoglobin, poc: 72.4 ng/mL (ref 12–200)
Troponin i, poc: <0.05 ng/mL (ref 0.00–0.09)

## 2010-12-26 LAB — D-DIMER, QUANTITATIVE: D-Dimer, Quant: 0.3 ug/mL-FEU (ref 0.00–0.48)

## 2010-12-26 LAB — URINE MICROSCOPIC-ADD ON

## 2010-12-30 ENCOUNTER — Ambulatory Visit (HOSPITAL_COMMUNITY)
Admission: RE | Admit: 2010-12-30 | Discharge: 2010-12-30 | Disposition: A | Discharge status: Home / Self Care | Payer: BC Managed Care – PPO | Source: Ambulatory Visit | Attending: Urology | Admitting: Urology

## 2010-12-30 ENCOUNTER — Other Ambulatory Visit: Payer: Self-pay | Admitting: Urology

## 2010-12-30 DIAGNOSIS — Z85528 Personal history of other malignant neoplasm of kidney: Secondary | ICD-10-CM | POA: Insufficient documentation

## 2010-12-30 DIAGNOSIS — M899 Disorder of bone, unspecified: Secondary | ICD-10-CM | POA: Insufficient documentation

## 2010-12-30 DIAGNOSIS — Z8051 Family history of malignant neoplasm of kidney: Secondary | ICD-10-CM

## 2010-12-30 DIAGNOSIS — I10 Essential (primary) hypertension: Secondary | ICD-10-CM | POA: Insufficient documentation

## 2010-12-30 DIAGNOSIS — M949 Disorder of cartilage, unspecified: Secondary | ICD-10-CM | POA: Insufficient documentation

## 2011-03-19 ENCOUNTER — Other Ambulatory Visit: Payer: Self-pay | Admitting: Gynecology

## 2011-09-02 ENCOUNTER — Encounter (HOSPITAL_COMMUNITY): Payer: Self-pay | Admitting: *Deleted

## 2011-09-02 ENCOUNTER — Inpatient Hospital Stay (HOSPITAL_COMMUNITY)
Admission: EM | Admit: 2011-09-02 | Discharge: 2011-09-04 | DRG: 174 | Disposition: A | Discharge status: Home / Self Care | Payer: BC Managed Care – PPO | Source: Ambulatory Visit | Attending: Internal Medicine | Admitting: Internal Medicine

## 2011-09-02 DIAGNOSIS — Z79899 Other long term (current) drug therapy: Secondary | ICD-10-CM

## 2011-09-02 DIAGNOSIS — K5730 Diverticulosis of large intestine without perforation or abscess without bleeding: Secondary | ICD-10-CM | POA: Diagnosis present

## 2011-09-02 DIAGNOSIS — K5790 Diverticulosis of intestine, part unspecified, without perforation or abscess without bleeding: Secondary | ICD-10-CM | POA: Diagnosis present

## 2011-09-02 DIAGNOSIS — K644 Residual hemorrhoidal skin tags: Secondary | ICD-10-CM | POA: Diagnosis present

## 2011-09-02 DIAGNOSIS — E876 Hypokalemia: Secondary | ICD-10-CM | POA: Diagnosis present

## 2011-09-02 DIAGNOSIS — R42 Dizziness and giddiness: Secondary | ICD-10-CM | POA: Diagnosis present

## 2011-09-02 DIAGNOSIS — C649 Malignant neoplasm of unspecified kidney, except renal pelvis: Secondary | ICD-10-CM | POA: Diagnosis present

## 2011-09-02 DIAGNOSIS — K625 Hemorrhage of anus and rectum: Secondary | ICD-10-CM

## 2011-09-02 DIAGNOSIS — I10 Essential (primary) hypertension: Secondary | ICD-10-CM | POA: Diagnosis present

## 2011-09-02 DIAGNOSIS — L408 Other psoriasis: Secondary | ICD-10-CM | POA: Diagnosis present

## 2011-09-02 DIAGNOSIS — Z7982 Long term (current) use of aspirin: Secondary | ICD-10-CM

## 2011-09-02 DIAGNOSIS — E039 Hypothyroidism, unspecified: Secondary | ICD-10-CM | POA: Diagnosis present

## 2011-09-02 DIAGNOSIS — K922 Gastrointestinal hemorrhage, unspecified: Secondary | ICD-10-CM | POA: Diagnosis present

## 2011-09-02 HISTORY — DX: Cardiac murmur, unspecified: R01.1

## 2011-09-02 HISTORY — DX: Essential (primary) hypertension: I10

## 2011-09-02 HISTORY — DX: Psoriasis, unspecified: L40.9

## 2011-09-02 HISTORY — DX: Malignant (primary) neoplasm, unspecified: C80.1

## 2011-09-02 LAB — COMPREHENSIVE METABOLIC PANEL
Alkaline Phosphatase: 65 U/L (ref 39–117)
BUN: 18 mg/dL (ref 6–23)
Chloride: 101 mEq/L (ref 96–112)
GFR calc Af Amer: 84 mL/min — ABNORMAL LOW (ref >90)
Glucose, Bld: 153 mg/dL — ABNORMAL HIGH (ref 70–99)
Potassium: 3.1 mEq/L — ABNORMAL LOW (ref 3.5–5.1)
Total Bilirubin: 0.3 mg/dL (ref 0.3–1.2)

## 2011-09-02 LAB — CBC
HCT: 37 % (ref 36.0–46.0)
Hemoglobin: 12.3 g/dL (ref 12.0–15.0)
RBC: 4.15 MIL/uL (ref 3.87–5.11)
WBC: 13.3 10*3/uL — ABNORMAL HIGH (ref 4.0–10.5)

## 2011-09-02 LAB — LACTIC ACID, PLASMA: Lactic Acid, Venous: 2.1 mmol/L (ref 0.5–2.2)

## 2011-09-02 MED ORDER — POTASSIUM CHLORIDE IN NACL 20-0.9 MEQ/L-% IV SOLN
Freq: Once | INTRAVENOUS | Status: AC
  Administered 2011-09-03: 01:00:00 via INTRAVENOUS
  Filled 2011-09-02: qty 1000

## 2011-09-02 MED ORDER — IOHEXOL 300 MG/ML  SOLN
20.0000 mL | INTRAMUSCULAR | Status: AC
  Administered 2011-09-02: 20 mL via ORAL

## 2011-09-02 MED ORDER — SODIUM CHLORIDE 0.9 % IV BOLUS (SEPSIS)
1000.0000 mL | Freq: Once | INTRAVENOUS | Status: AC
  Administered 2011-09-02: 1000 mL via INTRAVENOUS

## 2011-09-02 NOTE — ED Notes (Signed)
Patient states she was in the kitchen and felt like she had to pass gas.  Noticed some bright red and dark blood from her rectum.  Upon entering the room patient was pale, cool and clammy.  She feel back in the bed, eyes open as if she was going to pass out.  Yelled for help.  Patient was talking and became very tearful

## 2011-09-02 NOTE — ED Provider Notes (Signed)
History     CSN: [id number]  Arrival date & time 09/02/11  2001   First MD Initiated Contact with Patient 09/02/11 2116      Chief Complaint  Patient presents with  . Rectal Bleeding    (Consider location/radiation/quality/duration/timing/severity/associated sxs/prior treatment) HPI Comments: 61 yo female with history of HTN, psoriasis presents acutely with rectal bleeding. Had felt well today and was standing in the kitchen, felt sensation of needing to pass gas. Then she felt liquid and her clothes were saturated with blood. She went to bathroom and noted dark red blood passing into toilet, uncontrollable. She had some mild lower abdominal cramping. Came to ER for evaluation and had a second episode of passing dark red blood per rectum, filling the toilet. She had a CT colonoscopy performed 7/12 negative except for diverticula and small "sessile area" near sigmoid of undetermined signficance. Takes a daily baby aspirin.   Denies any nausea, abdominal pain, fever, emesis, reflux, rash, recent low blood pressure, antibiotic use  Patient is a 61 y.o. female presenting with hematochezia.  Rectal Bleeding  Pertinent negatives include no fever, no hematuria, no vaginal bleeding, no chest pain, no headaches and no coughing.    Past Medical History  Diagnosis Date  . Thyroid disease   . Psoriasis   . Hypertension   . Heart murmur   . Cancer     Past Surgical History  Procedure Date  . Nephrectomy   . Cesarean section     History reviewed. No pertinent family history.  History  Substance Use Topics  . Smoking status: Never Smoker   . Smokeless tobacco: Not on file  . Alcohol Use: No    OB History    Grav Para Term Preterm Abortions TAB SAB Ect Mult Living                  Review of Systems  Constitutional: Negative for fever, chills and fatigue.  Respiratory: Negative for cough, chest tightness and shortness of breath.   Cardiovascular: Negative for chest pain,  palpitations and leg swelling.  Gastrointestinal: Positive for hematochezia. Negative for abdominal distention.  Genitourinary: Negative for dysuria, hematuria, flank pain, vaginal bleeding and difficulty urinating.  Musculoskeletal: Negative for back pain.  Neurological: Positive for dizziness and light-headedness. Negative for syncope and headaches.  Psychiatric/Behavioral: Negative for confusion.  All other systems reviewed and are negative.    Allergies  Review of patient's allergies indicates no known allergies.  Home Medications   Current Outpatient Rx  Name Route Sig Dispense Refill  . ASPIRIN EC 81 MG PO TBEC Oral Take 81 mg by mouth daily.    . ATORVASTATIN CALCIUM 10 MG PO TABS Oral Take 10 mg by mouth daily.    Marland Kitchen DILTIAZEM HCL ER 240 MG PO CP24 Oral Take 240 mg by mouth daily.    Marland Kitchen FERROUS SULFATE 325 (65 FE) MG PO TABS Oral Take 325 mg by mouth every 7 (seven) days. On Fridays only    . HYDROCHLOROTHIAZIDE 25 MG PO TABS Oral Take 25 mg by mouth daily.    Marland Kitchen LEVOTHYROXINE SODIUM 100 MCG PO TABS Oral Take 100 mcg by mouth daily.    Marland Kitchen METHOTREXATE 2.5 MG PO TABS Oral Take 5 mg by mouth every 7 (seven) days. On Fridays only; Caution:Chemotherapy. Protect from light.    . ADULT MULTIVITAMIN W/MINERALS CH Oral Take 1 tablet by mouth daily.      BP 139/84  Pulse 121  Temp 98.2 F (36.8  C) (Oral)  Resp 18  SpO2 98%  Physical Exam  Vitals reviewed. Constitutional: She is oriented to person, place, and time. She appears well-nourished.  HENT:  Head: Normocephalic and atraumatic.  Mouth/Throat: Oropharynx is clear and moist.  Eyes: EOM are normal. Pupils are equal, round, and reactive to light. No scleral icterus.  Neck: Neck supple.  Cardiovascular: Regular rhythm and normal heart sounds.   No murmur heard.      Mild tachycardia.  Pulmonary/Chest: Effort normal and breath sounds normal. No respiratory distress. She has no wheezes. She has no rales.  Abdominal: Soft.  Bowel sounds are normal. She exhibits no distension and no mass. There is no tenderness. There is no rebound and no guarding.  Genitourinary:       Rectal shows tiny external hemorrhoid, no active bleeding.   Musculoskeletal: She exhibits no edema and no tenderness.  Neurological: She is alert and oriented to person, place, and time. Coordination normal.  Skin: No rash noted. She is not diaphoretic.  Psychiatric: She has a normal mood and affect. Her behavior is normal. Thought content normal.    ED Course  Procedures (including critical care time)  Labs Reviewed  CBC - Abnormal; Notable for the following:    WBC 13.3 (*)     All other components within normal limits  COMPREHENSIVE METABOLIC PANEL - Abnormal; Notable for the following:    Potassium 3.1 (*)     Glucose, Bld 153 (*)     GFR calc non Af Amer 72 (*)     GFR calc Af Amer 84 (*)     All other components within normal limits  LACTIC ACID, PLASMA   CBC    Component Value Date/Time   WBC 13.3* 09/02/2011 2058   RBC 4.15 09/02/2011 2058   HGB 12.3 09/02/2011 2058   HCT 37.0 09/02/2011 2058   PLT 268 09/02/2011 2058   MCV 89.2 09/02/2011 2058   MCH 29.6 09/02/2011 2058   MCHC 33.2 09/02/2011 2058   RDW 14.4 09/02/2011 2058   LYMPHSABS 1.4 03/19/2008 0515   MONOABS 0.3 03/19/2008 0515   EOSABS 0.3 03/19/2008 0515   BASOSABS 0.0 03/19/2008 0515   Lactate 2.1  No results found.   No diagnosis found.    MDM  61 yo female on aspirin therapy and methotrexate for psoriasis presents with acute painless hematochezia. Most likely diverticular vs colitis. Mild tachycardia and orthostatic, will start IVF hydration.  Last episode was ~2 hours ago. Plan to check lactate, CMET, CT abdomen/pelvis to rule out infectious etiology/colitis and alternative intestinal pathology with elevated WBC. Hold antiplatelets.  11:26 PM Tachycardia stabilized, normotensive. No further bleeding. Await CT results and IVF NS + 20 KCL for mild  hypokalemia.         Durwin Reges, MD 09/02/11 [phone]

## 2011-09-02 NOTE — ED Notes (Addendum)
"  Was standing in kitchen, thought she needed to pass gas and reached around an noticed blood from her rectum", describes as: "a lot, bright and dark, not much stool, some clots, all liquid", abd cramping developing and increasing, takes ASA QD, h/o colonoscopy, also takes iron, h/o anemia. Also, "feel weak, tired and light headed", (denies: sob or dizziness). HR noted to be 121.

## 2011-09-02 NOTE — ED Notes (Signed)
Contrast completed, CT aware.

## 2011-09-03 ENCOUNTER — Emergency Department (HOSPITAL_COMMUNITY): Payer: BC Managed Care – PPO

## 2011-09-03 ENCOUNTER — Encounter (HOSPITAL_COMMUNITY): Payer: Self-pay | Admitting: Radiology

## 2011-09-03 DIAGNOSIS — E032 Hypothyroidism due to medicaments and other exogenous substances: Secondary | ICD-10-CM

## 2011-09-03 DIAGNOSIS — K5731 Diverticulosis of large intestine without perforation or abscess with bleeding: Secondary | ICD-10-CM

## 2011-09-03 DIAGNOSIS — E039 Hypothyroidism, unspecified: Secondary | ICD-10-CM | POA: Diagnosis present

## 2011-09-03 DIAGNOSIS — K922 Gastrointestinal hemorrhage, unspecified: Secondary | ICD-10-CM

## 2011-09-03 DIAGNOSIS — I10 Essential (primary) hypertension: Secondary | ICD-10-CM | POA: Diagnosis present

## 2011-09-03 DIAGNOSIS — K5790 Diverticulosis of intestine, part unspecified, without perforation or abscess without bleeding: Secondary | ICD-10-CM | POA: Diagnosis present

## 2011-09-03 LAB — CBC
HCT: 29.1 % — ABNORMAL LOW (ref 36.0–46.0)
HCT: 30.9 % — ABNORMAL LOW (ref 36.0–46.0)
Hemoglobin: 9.6 g/dL — ABNORMAL LOW (ref 12.0–15.0)
Hemoglobin: 10 g/dL — ABNORMAL LOW (ref 12.0–15.0)
MCH: 30 pg (ref 26.0–34.0)
MCH: 30 pg (ref 26.0–34.0)
MCH: 29.6 pg (ref 26.0–34.0)
MCHC: 33 g/dL (ref 30.0–36.0)
MCHC: 33 g/dL (ref 30.0–36.0)
MCHC: 32.4 g/dL (ref 30.0–36.0)
MCV: 90.9 fL (ref 78.0–100.0)
MCV: 91.4 fL (ref 78.0–100.0)
Platelets: 194 K/uL (ref 150–400)
Platelets: 198 10*3/uL (ref 150–400)
Platelets: 208 10*3/uL (ref 150–400)
Platelets: 214 K/uL (ref 150–400)
RBC: 3.2 MIL/uL — ABNORMAL LOW (ref 3.87–5.11)
RBC: 3.17 MIL/uL — ABNORMAL LOW (ref 3.87–5.11)
RBC: 3.45 MIL/uL — ABNORMAL LOW (ref 3.87–5.11)
RBC: 3.38 MIL/uL — ABNORMAL LOW (ref 3.87–5.11)
RDW: 14.5 % (ref 11.5–15.5)
RDW: 14.9 % (ref 11.5–15.5)
RDW: 14.9 % (ref 11.5–15.5)
WBC: 6.5 K/uL (ref 4.0–10.5)
WBC: 5.7 10*3/uL (ref 4.0–10.5)
WBC: 5.8 K/uL (ref 4.0–10.5)

## 2011-09-03 LAB — ABO/RH: ABO/RH(D): A POS

## 2011-09-03 LAB — TSH: TSH: 2.177 u[IU]/mL (ref 0.350–4.500)

## 2011-09-03 MED ORDER — KCL IN DEXTROSE-NACL 20-5-0.9 MEQ/L-%-% IV SOLN
INTRAVENOUS | Status: DC
  Administered 2011-09-03: 06:00:00 via INTRAVENOUS
  Filled 2011-09-03 (×2): qty 1000

## 2011-09-03 MED ORDER — SODIUM CHLORIDE 0.9 % IJ SOLN
3.0000 mL | Freq: Two times a day (BID) | INTRAMUSCULAR | Status: DC
  Administered 2011-09-03 – 2011-09-04 (×3): 3 mL via INTRAVENOUS

## 2011-09-03 MED ORDER — DILTIAZEM HCL ER 240 MG PO CP24
240.0000 mg | ORAL_CAPSULE | Freq: Every day | ORAL | Status: DC
  Administered 2011-09-03 – 2011-09-04 (×2): 240 mg via ORAL
  Filled 2011-09-03 (×2): qty 1

## 2011-09-03 MED ORDER — IOHEXOL 300 MG/ML  SOLN
75.0000 mL | Freq: Once | INTRAMUSCULAR | Status: AC | PRN
  Administered 2011-09-03: 75 mL via INTRAVENOUS

## 2011-09-03 MED ORDER — POTASSIUM CHLORIDE CRYS ER 20 MEQ PO TBCR
40.0000 meq | EXTENDED_RELEASE_TABLET | Freq: Every day | ORAL | Status: DC
  Administered 2011-09-03 – 2011-09-04 (×2): 40 meq via ORAL
  Filled 2011-09-03 (×2): qty 2

## 2011-09-03 MED ORDER — BIOTENE DRY MOUTH MT LIQD
15.0000 mL | Freq: Two times a day (BID) | OROMUCOSAL | Status: DC
  Administered 2011-09-03 – 2011-09-04 (×3): 15 mL via OROMUCOSAL

## 2011-09-03 MED ORDER — HYDROCHLOROTHIAZIDE 25 MG PO TABS
25.0000 mg | ORAL_TABLET | Freq: Every day | ORAL | Status: DC
  Administered 2011-09-03: 25 mg via ORAL
  Filled 2011-09-03: qty 1

## 2011-09-03 MED ORDER — LEVOTHYROXINE SODIUM 100 MCG PO TABS
100.0000 ug | ORAL_TABLET | Freq: Every day | ORAL | Status: DC
  Administered 2011-09-03 – 2011-09-04 (×2): 100 ug via ORAL
  Filled 2011-09-03 (×3): qty 1

## 2011-09-03 MED ORDER — METHOTREXATE 2.5 MG PO TABS
5.0000 mg | ORAL_TABLET | ORAL | Status: DC
  Administered 2011-09-04: 5 mg via ORAL
  Filled 2011-09-03: qty 2

## 2011-09-03 MED ORDER — ATORVASTATIN CALCIUM 10 MG PO TABS
10.0000 mg | ORAL_TABLET | Freq: Every day | ORAL | Status: DC
  Administered 2011-09-03 – 2011-09-04 (×2): 10 mg via ORAL
  Filled 2011-09-03 (×2): qty 1

## 2011-09-03 MED ORDER — ADULT MULTIVITAMIN W/MINERALS CH
1.0000 | ORAL_TABLET | Freq: Every day | ORAL | Status: DC
  Administered 2011-09-03 – 2011-09-04 (×2): 1 via ORAL
  Filled 2011-09-03 (×2): qty 1

## 2011-09-03 MED ORDER — PANTOPRAZOLE SODIUM 40 MG PO TBEC
40.0000 mg | DELAYED_RELEASE_TABLET | Freq: Every day | ORAL | Status: DC
  Administered 2011-09-03 – 2011-09-04 (×2): 40 mg via ORAL
  Filled 2011-09-03 (×2): qty 1

## 2011-09-03 NOTE — Progress Notes (Signed)
During bedside report patient had family members in the room, patient stated that she would rather report done outside the room_______________________________________________________________________D. Manson Passey RN

## 2011-09-03 NOTE — Progress Notes (Signed)
Patients states she had BM and there was a small amount of blood in stool but not a large amount, MD notified and no orders given at this time will continue to monitor patient____________________________________D. Manson Passey RN

## 2011-09-03 NOTE — Progress Notes (Signed)
Utilization review completed.  

## 2011-09-03 NOTE — H&P (Signed)
PCP: Dr. Windy Fast polite   Chief Complaint: Bright red blood per rectum   HPI: Melissa Henry is an 61 y.o. female with history of hypothyroidism, hypertension, diverticulosis with last virtual colonoscopy approximately one year ago, presents to the emergency room with bright red blood per rectum. She has minimal abdominal discomfort, no nausea, vomiting, fever, chills, or any GU complaints. She also had a couple of episodes in the emergency room as well. Evaluation included a hemoglobin of 12.3 g per decaliter, white count of 13,000, BUN of 18. She also had a unremarkable abdominal pelvic CT, with evidence of status post left nephrectomy. She maintained hemodynamic stability in the emergency room, but experienced one brief episode of vagal reaction. Hospitalist was asked to admit her for lower GI bleed. She denied nonsteroidal anti-inflammatory drug use.  Rewiew of Systems:  The patient denies anorexia, fever, weight loss,, vision loss, decreased hearing, hoarseness, chest pain, syncope, dyspnea on exertion, peripheral edema, balance deficits, hemoptysis,, melena,, severe indigestion/heartburn, hematuria, incontinence, genital sores, muscle weakness, suspicious skin lesions, transient blindness, difficulty walking, depression, unusual weight change, abnormal bleeding, enlarged lymph nodes, angioedema, and breast masses.   Past Medical History  Diagnosis Date  . Thyroid disease   . Psoriasis   . Hypertension   . Heart murmur   . Cancer     Past Surgical History  Procedure Date  . Nephrectomy   . Cesarean section     Medications:  HOME MEDS: Prior to Admission medications   Medication Sig Start Date End Date Taking? Authorizing Provider  aspirin EC 81 MG tablet Take 81 mg by mouth daily.   Yes Historical Provider, MD  atorvastatin (LIPITOR) 10 MG tablet Take 10 mg by mouth daily.   Yes Historical Provider, MD  diltiazem (DILACOR XR) 240 MG 24 hr capsule Take 240 mg by mouth daily.    Yes Historical Provider, MD  ferrous sulfate 325 (65 FE) MG tablet Take 325 mg by mouth every 7 (seven) days. On Fridays only   Yes Historical Provider, MD  hydrochlorothiazide (HYDRODIURIL) 25 MG tablet Take 25 mg by mouth daily.   Yes Historical Provider, MD  levothyroxine (SYNTHROID, LEVOTHROID) 100 MCG tablet Take 100 mcg by mouth daily.   Yes Historical Provider, MD  methotrexate (RHEUMATREX) 2.5 MG tablet Take 5 mg by mouth every 7 (seven) days. On Fridays only; Caution:Chemotherapy. Protect from light.   Yes Historical Provider, MD  Multiple Vitamin (MULTIVITAMIN WITH MINERALS) TABS Take 1 tablet by mouth daily.   Yes Historical Provider, MD     Allergies:  No Known Allergies  Social History:   reports that she has never smoked. She does not have any smokeless tobacco history on file. She reports that she does not drink alcohol or use illicit drugs.  Family History: History reviewed. No pertinent family history.   Physical Exam: Filed Vitals:   09/03/11 0130 09/03/11 0145 09/03/11 0200 09/03/11 0215  BP: 112/74 108/73 76/38 120/66  Pulse: 83 82 85 76  Temp:      TempSrc:      Resp:      SpO2: 99% 99% 99% 99%   Blood pressure 120/66, pulse 76, temperature 97.8 F (36.6 C), temperature source Oral, resp. rate 18, SpO2 99.00%.  GEN:  Pleasant  person lying in the stretcher in no acute distress; cooperative with exam PSYCH:  alert and oriented x4; does not appear anxious or depressed; affect is appropriate. HEENT: Mucous membranes pink and anicteric; PERRLA; EOM intact; no cervical  lymphadenopathy nor thyromegaly or carotid bruit; no JVD; Breasts:: Not examined CHEST WALL: No tenderness CHEST: Normal respiration, clear to auscultation bilaterally HEART: Regular rate and rhythm; no murmurs rubs or gallops BACK: No kyphosis or scoliosis; no CVA tenderness ABDOMEN: Obese, soft non-tender; no masses, no organomegaly, normal abdominal bowel sounds; no pannus; no intertriginous  candida. Rectal Exam: Not done EXTREMITIES: No bone or joint deformity; age-appropriate arthropathy of the hands and knees; no edema; no ulcerations. Genitalia: not examined PULSES: 2+ and symmetric SKIN: Normal hydration no rash or ulceration CNS: Cranial nerves 2-12 grossly intact no focal lateralizing neurologic deficit   Labs & Imaging Results for orders placed during the hospital encounter of 09/02/11 (from the past 48 hour(s))  CBC     Status: Abnormal   Collection Time   09/02/11  8:58 PM      Component Value Range Comment   WBC 13.3 (*) 4.0 - 10.5 K/uL    RBC 4.15  3.87 - 5.11 MIL/uL    Hemoglobin 12.3  12.0 - 15.0 g/dL    HCT 96.0  45.4 - 09.8 %    MCV 89.2  78.0 - 100.0 fL    MCH 29.6  26.0 - 34.0 pg    MCHC 33.2  30.0 - 36.0 g/dL    RDW 11.9  14.7 - 82.9 %    Platelets 268  150 - 400 K/uL   COMPREHENSIVE METABOLIC PANEL     Status: Abnormal   Collection Time   09/02/11  8:58 PM      Component Value Range Comment   Sodium 138  135 - 145 mEq/L    Potassium 3.1 (*) 3.5 - 5.1 mEq/L    Chloride 101  96 - 112 mEq/L    CO2 22  19 - 32 mEq/L    Glucose, Bld 153 (*) 70 - 99 mg/dL    BUN 18  6 - 23 mg/dL    Creatinine, Ser 5.62  0.50 - 1.10 mg/dL    Calcium 13.0  8.4 - 10.5 mg/dL    Total Protein 6.7  6.0 - 8.3 g/dL    Albumin 3.5  3.5 - 5.2 g/dL    AST 19  0 - 37 U/L    ALT 21  0 - 35 U/L    Alkaline Phosphatase 65  39 - 117 U/L    Total Bilirubin 0.3  0.3 - 1.2 mg/dL    GFR calc non Af Amer 72 (*) >90 mL/min    GFR calc Af Amer 84 (*) >90 mL/min   LACTIC ACID, PLASMA     Status: Normal   Collection Time   09/02/11  9:55 PM      Component Value Range Comment   Lactic Acid, Venous 2.1  0.5 - 2.2 mmol/L   TYPE AND SCREEN     Status: Normal   Collection Time   09/03/11  1:43 AM      Component Value Range Comment   ABO/RH(D) A POS      Antibody Screen NEG      Sample Expiration 09/06/2011      Ct Abdomen Pelvis W Contrast  09/03/2011  *RADIOLOGY REPORT*  Clinical  Data: Abdominal pain  CT ABDOMEN AND PELVIS WITH CONTRAST  Technique:  Multidetector CT imaging of the abdomen and pelvis was performed following the standard protocol during bolus administration of intravenous contrast.  Contrast: 1 OMNIPAQUE IOHEXOL 300 MG/ML  SOLN, 75mL OMNIPAQUE IOHEXOL 300 MG/ML  SOLN  Comparison: The 11/28/2010  Findings: Linear opacity in the lingula is likely scarring or atelectasis.  Small hiatal hernia.  There are multiple hypodensities scattered throughout the liver, some of which are in keeping with hemangiomas, some of which are incompletely characterized. Portosystemic shunt noted within the right hepatic lobe.  Numerous hypodensities and calcifications scattered throughout the spleen are nonspecific, unchanged.  Distended gallbladder.  No gallbladder wall thickening or radiodense gallstones.  No biliary ductal dilatation.  Unremarkable pancreas and adrenal glands.  Absent left kidney.  Multiple hypodensities within the right kidney are incompletely characterized and however without appreciable interval change.  No bowel obstruction.  No CT evidence for colitis.  Colonic diverticulosis.  No CT evidence for diverticulitis or colitis. Appendix within normal limits.  No free intraperitoneal air or fluid.  No lymphadenopathy.  There is scattered atherosclerotic calcification of the aorta and its branches. No aneurysmal dilatation.  Thin-walled bladder.  Unremarkable CT appearance to the uterus and adnexa.  Multilevel degenerative changes of the imaged spine. No acute or aggressive appearing osseous lesion. Lower thoracic hemangioma.  IMPRESSION: No acute abnormality identified.  Status post left nephrectomy.  Numerous hypodensities and calcifications scattered throughout the spleen, unchanged.  Original Report Authenticated By: Waneta Martins, M.D.      Assessment Present on Admission:  .Lower GI bleed .Diverticulosis .Hypertension .Hypothyroid   PLAN:  Will admit her to  telemetry. We will obtain serial H&H every 6 hours. She will be given clear liquid. I suspect that this is diverticular bleed, and if she has no further bleeding, with stable H&H, perhaps no endoscopy is necessary. There's no evidence of infection. As for her hypothyroidism, will continue supplements and check TSH. Her blood pressures and the drainage at this time as well. I will discontinue her aspirin for now. Will admit her to Dr. Windy Fast polite's service as per prior arrangement. She is a full code.   Other plans as per orders.    Isaiah Torok 09/03/2011, 3:00 AM

## 2011-09-03 NOTE — Progress Notes (Signed)
Subjective: Admission H&P review, hemoglobin has trended down to 9.6, no bleeding this morning. No abdominal pain, leukocytosis resolved, probable stress to margination. Patient denies any history of GI bleed she has known diverticulosis  Objective: Vital signs in last 24 hours: Temp:  [97.8 F (36.6 C)-98.2 F (36.8 C)] 97.9 F (36.6 C) (06/13 0928) Pulse Rate:  [67-121] 67  (06/13 0928) Resp:  [16-18] 17  (06/13 0928) BP: (76-139)/(38-104) 108/72 mmHg (06/13 0928) SpO2:  [93 %-100 %] 93 % (06/13 0928) Weight:  [84.596 kg (186 lb 8 oz)] 84.596 kg (186 lb 8 oz) (06/13 0428) Weight change:  Last BM Date: 09/02/11  Intake/Output from previous day: 06/12 0701 - 06/13 0700 In: -  Out: 800 [Urine:800] Intake/Output this shift: Total I/O In: 392.7 [P.O.:120; I.V.:272.7] Out: 450 [Urine:450]  General appearance: alert and cooperative Resp: clear to auscultation bilaterally Cardio: regular rate and rhythm, S1, S2 normal, no murmur, click, rub or gallop GI: soft, non-tender; bowel sounds normal; no masses,  no organomegaly Extremities: extremities normal, atraumatic, no cyanosis or edema  Lab Results:  Results for orders placed during the hospital encounter of 09/02/11 (from the past 24 hour(s))  CBC     Status: Abnormal   Collection Time   09/02/11  8:58 PM      Component Value Range   WBC 13.3 (*) 4.0 - 10.5 K/uL   RBC 4.15  3.87 - 5.11 MIL/uL   Hemoglobin 12.3  12.0 - 15.0 g/dL   HCT 16.1  09.6 - 04.5 %   MCV 89.2  78.0 - 100.0 fL   MCH 29.6  26.0 - 34.0 pg   MCHC 33.2  30.0 - 36.0 g/dL   RDW 40.9  81.1 - 91.4 %   Platelets 268  150 - 400 K/uL  COMPREHENSIVE METABOLIC PANEL     Status: Abnormal   Collection Time   09/02/11  8:58 PM      Component Value Range   Sodium 138  135 - 145 mEq/L   Potassium 3.1 (*) 3.5 - 5.1 mEq/L   Chloride 101  96 - 112 mEq/L   CO2 22  19 - 32 mEq/L   Glucose, Bld 153 (*) 70 - 99 mg/dL   BUN 18  6 - 23 mg/dL   Creatinine, Ser 7.82  0.50 -  1.10 mg/dL   Calcium 95.6  8.4 - 21.3 mg/dL   Total Protein 6.7  6.0 - 8.3 g/dL   Albumin 3.5  3.5 - 5.2 g/dL   AST 19  0 - 37 U/L   ALT 21  0 - 35 U/L   Alkaline Phosphatase 65  39 - 117 U/L   Total Bilirubin 0.3  0.3 - 1.2 mg/dL   GFR calc non Af Amer 72 (*) >90 mL/min   GFR calc Af Amer 84 (*) >90 mL/min  LACTIC ACID, PLASMA     Status: Normal   Collection Time   09/02/11  9:55 PM      Component Value Range   Lactic Acid, Venous 2.1  0.5 - 2.2 mmol/L  ABO/RH     Status: Normal   Collection Time   09/03/11  1:42 AM      Component Value Range   ABO/RH(D) A POS    TYPE AND SCREEN     Status: Normal   Collection Time   09/03/11  1:43 AM      Component Value Range   ABO/RH(D) A POS     Antibody Screen  NEG     Sample Expiration 09/06/2011    CBC     Status: Abnormal   Collection Time   09/03/11  5:25 AM      Component Value Range   WBC 6.5  4.0 - 10.5 K/uL   RBC 3.20 (*) 3.87 - 5.11 MIL/uL   Hemoglobin 9.6 (*) 12.0 - 15.0 g/dL   HCT 11.9 (*) 14.7 - 82.9 %   MCV 90.9  78.0 - 100.0 fL   MCH 30.0  26.0 - 34.0 pg   MCHC 33.0  30.0 - 36.0 g/dL   RDW 56.2  13.0 - 86.5 %   Platelets 194  150 - 400 K/uL  TSH     Status: Normal   Collection Time   09/03/11  5:25 AM      Component Value Range   TSH 2.177  0.350 - 4.500 uIU/mL  CBC     Status: Abnormal   Collection Time   09/03/11 11:15 AM      Component Value Range   WBC 5.4  4.0 - 10.5 K/uL   RBC 3.17 (*) 3.87 - 5.11 MIL/uL   Hemoglobin 9.5 (*) 12.0 - 15.0 g/dL   HCT 78.4 (*) 69.6 - 29.5 %   MCV 90.9  78.0 - 100.0 fL   MCH 30.0  26.0 - 34.0 pg   MCHC 33.0  30.0 - 36.0 g/dL   RDW 28.4  13.2 - 44.0 %   Platelets 198  150 - 400 K/uL      Studies/Results: Ct Abdomen Pelvis W Contrast  09/03/2011  *RADIOLOGY REPORT*  Clinical Data: Abdominal pain  CT ABDOMEN AND PELVIS WITH CONTRAST  Technique:  Multidetector CT imaging of the abdomen and pelvis was performed following the standard protocol during bolus administration of  intravenous contrast.  Contrast: 1 OMNIPAQUE IOHEXOL 300 MG/ML  SOLN, 75mL OMNIPAQUE IOHEXOL 300 MG/ML  SOLN  Comparison: The 11/28/2010  Findings: Linear opacity in the lingula is likely scarring or atelectasis.  Small hiatal hernia.  There are multiple hypodensities scattered throughout the liver, some of which are in keeping with hemangiomas, some of which are incompletely characterized. Portosystemic shunt noted within the right hepatic lobe.  Numerous hypodensities and calcifications scattered throughout the spleen are nonspecific, unchanged.  Distended gallbladder.  No gallbladder wall thickening or radiodense gallstones.  No biliary ductal dilatation.  Unremarkable pancreas and adrenal glands.  Absent left kidney.  Multiple hypodensities within the right kidney are incompletely characterized and however without appreciable interval change.  No bowel obstruction.  No CT evidence for colitis.  Colonic diverticulosis.  No CT evidence for diverticulitis or colitis. Appendix within normal limits.  No free intraperitoneal air or fluid.  No lymphadenopathy.  There is scattered atherosclerotic calcification of the aorta and its branches. No aneurysmal dilatation.  Thin-walled bladder.  Unremarkable CT appearance to the uterus and adnexa.  Multilevel degenerative changes of the imaged spine. No acute or aggressive appearing osseous lesion. Lower thoracic hemangioma.  IMPRESSION: No acute abnormality identified.  Status post left nephrectomy.  Numerous hypodensities and calcifications scattered throughout the spleen, unchanged.  Original Report Authenticated By: Waneta Martins, M.D.    Medications:  Prior to Admission:  Prescriptions prior to admission  Medication Sig Dispense Refill  . aspirin EC 81 MG tablet Take 81 mg by mouth daily.      Marland Kitchen atorvastatin (LIPITOR) 10 MG tablet Take 10 mg by mouth daily.      Marland Kitchen diltiazem (DILACOR XR) 240 MG  24 hr capsule Take 240 mg by mouth daily.      . ferrous sulfate  325 (65 FE) MG tablet Take 325 mg by mouth daily with supper.      . folic acid (FOLVITE) 1 MG tablet Take 1 mg by mouth every Friday.      . hydrochlorothiazide (HYDRODIURIL) 25 MG tablet Take 25 mg by mouth daily.      Marland Kitchen levothyroxine (SYNTHROID, LEVOTHROID) 100 MCG tablet Take 100 mcg by mouth daily.      . methotrexate (RHEUMATREX) 2.5 MG tablet Take 5 mg by mouth See admin instructions. Pt takes 5mg  morning and 5mg  in the evening on Fridays. Caution:Chemotherapy. Protect from light.      . Multiple Vitamin (MULTIVITAMIN WITH MINERALS) TABS Take 1 tablet by mouth daily.       Scheduled:   . 0.9 % NaCl with KCl 20 mEq / L   Intravenous Once  . antiseptic oral rinse  15 mL Mouth Rinse BID  . atorvastatin  10 mg Oral Daily  . diltiazem  240 mg Oral Daily  . hydrochlorothiazide  25 mg Oral Daily  . iohexol  20 mL Oral Q1 Hr x 2  . levothyroxine  100 mcg Oral Q0600  . methotrexate  5 mg Oral Q Fri  . multivitamin with minerals  1 tablet Oral Daily  . pantoprazole  40 mg Oral Q0600  . potassium chloride  40 mEq Oral Daily  . sodium chloride  1,000 mL Intravenous Once  . sodium chloride  3 mL Intravenous Q12H  . sodium chloride  3 mL Intravenous Q12H   Continuous:   . DISCONTD: dextrose 5 % and 0.9 % NaCl with KCl 20 mEq/L 100 mL/hr at 09/03/11 4098    Assessment/Plan: GI bleed probable diverticular in etiology, follow serial H&H, saline lock IV fluids, full liquid diet. Patient has had a virtual colonoscopy in the past because of a tortuous colon without any pathology been found, if no recurrent bleed we'll presumed diverticular in etiology and have further outpatient followup Hypertension Hypothyroidism Renal cell CA Hypokalemia, replete orally   LOS: 1 day   Chena Chohan D 09/03/2011, 12:51 PM

## 2011-09-04 LAB — BASIC METABOLIC PANEL
CO2: 26 mEq/L (ref 19–32)
GFR calc non Af Amer: >90 mL/min (ref >90)
Glucose, Bld: 101 mg/dL — ABNORMAL HIGH (ref 70–99)
Potassium: 3.8 mEq/L (ref 3.5–5.1)
Sodium: 142 mEq/L (ref 135–145)

## 2011-09-04 NOTE — Progress Notes (Signed)
Prior to patient ambulating the hallways O2 sats were 96-97%, while ambulating O2 sats remained above 95%; after ambulating patient's o2 sats were 96%_____________________________________________D. Manson Passey RN

## 2011-09-04 NOTE — Progress Notes (Signed)
Patient's IV and tele has been discontinued; patient verbalizes understanding of discharge information__________________D. Manson Passey RN

## 2011-09-04 NOTE — Discharge Summary (Addendum)
Physician Discharge Summary  Patient ID: Melissa Henry MRN: [id number] DOB/AGE: July 13, 1950 61 y.o.  Admit date: 09/02/2011 Discharge date: 09/04/2011  Admission Diagnoses: Bright red blood per rectum Discharge Diagnoses:  Principal Problem:  *Lower GI bleed Active Problems:  Diverticulosis  Hypertension  Hypothyroid  Psoriasis Renal cell carcinoma status post left nephrectomy Hyperlipidemia Overweight Hypokalemia repleted  Discharged Condition: stable  Hospital Course:   Patient presented to the hospital with complaint of very blood per rectum in emergency room she was evaluated hemoglobin was 12.3, CT of the abdomen and pelvis obtained without any acute pathology. Patient has known diverticular disease. Admission was deemed necessary for further evaluation and treatment. Patient had serial hemoglobins, final H&H 10. No recurrent bleeding while hospitalized. No nausea no vomiting, for tolerance of regular diet. Patient had difficult colonoscopy in the past secondary to extensive bowel loops therefore she's been followed with a virtual colonoscopy since that time. Last was in 2012 without any acute pathology. At this time patient is at her baseline level of function she will resume her medications and have outpatient followup in about 2 weeks.  Consults:    Significant Diagnostic Studies:Ct Abdomen Pelvis W Contrast  09/03/2011  *RADIOLOGY REPORT*  Clinical Data: Abdominal pain  CT ABDOMEN AND PELVIS WITH CONTRAST  Technique:  Multidetector CT imaging of the abdomen and pelvis was performed following the standard protocol during bolus administration of intravenous contrast.  Contrast: 1 OMNIPAQUE IOHEXOL 300 MG/ML  SOLN, 75mL OMNIPAQUE IOHEXOL 300 MG/ML  SOLN  Comparison: The 11/28/2010  Findings: Linear opacity in the lingula is likely scarring or atelectasis.  Small hiatal hernia.  There are multiple hypodensities scattered throughout the liver, some of which are in keeping with  hemangiomas, some of which are incompletely characterized. Portosystemic shunt noted within the right hepatic lobe.  Numerous hypodensities and calcifications scattered throughout the spleen are nonspecific, unchanged.  Distended gallbladder.  No gallbladder wall thickening or radiodense gallstones.  No biliary ductal dilatation.  Unremarkable pancreas and adrenal glands.  Absent left kidney.  Multiple hypodensities within the right kidney are incompletely characterized and however without appreciable interval change.  No bowel obstruction.  No CT evidence for colitis.  Colonic diverticulosis.  No CT evidence for diverticulitis or colitis. Appendix within normal limits.  No free intraperitoneal air or fluid.  No lymphadenopathy.  There is scattered atherosclerotic calcification of the aorta and its branches. No aneurysmal dilatation.  Thin-walled bladder.  Unremarkable CT appearance to the uterus and adnexa.  Multilevel degenerative changes of the imaged spine. No acute or aggressive appearing osseous lesion. Lower thoracic hemangioma.  IMPRESSION: No acute abnormality identified.  Status post left nephrectomy.  Numerous hypodensities and calcifications scattered throughout the spleen, unchanged.  Original Report Authenticated By: Waneta Martins, M.D.   DATA  Abnormal Collection: 09/03/11 2231 Resulted: 09/04/11 0001 Specimen Type: Blood Sodium 142 mEq/L Potassium 3.8 mEq/L Chloride 106 mEq/L CO2 26 mEq/L Glucose, Bld 101 mg/dL H BUN 12 mg/dL Creatinine, Ser 8.75 mg/dL Calcium 9.3 mg/dL GFR calc non Af Amer >64 mL/min GFR calc Af Amer >90 mL/min CBC [[id number]] Abnormal Collection: 09/03/11 2231 Resulted: 09/03/11 2331 Specimen Type: Blood WBC 5.8 K/uL RBC 3.38 MIL/uL L Hemoglobin 10.0 g/dL L HCT 41.6 % L MCV 60.6 fL MCH 29.6 pg MCHC 32.4 g/dL RDW 30.1 % Platelets 601 K/uL CBC [[id number]]    Discharge Exam: Blood pressure 139/81, pulse 101, temperature 99.3 F (37.4 C), temperature source Oral, resp.  rate 18, height 5\' 2"  (1.575 m),  weight 81.36 kg (179 lb 5.9 oz), SpO2 96.00%. General appearance: alert and cooperative Resp: clear to auscultation bilaterally Cardio: regular rate and rhythm, S1, S2 normal, no murmur, click, rub or gallop GI: soft, non-tender; bowel sounds normal; no masses,  no organomegaly Extremities: extremities normal, atraumatic, no cyanosis or edema  Disposition:    Medication List  As of 09/04/2011  1:34 PM   STOP taking these medications         aspirin EC 81 MG tablet         TAKE these medications         atorvastatin 10 MG tablet   Commonly known as: LIPITOR   Take 10 mg by mouth daily.      diltiazem 240 MG 24 hr capsule   Commonly known as: DILACOR XR   Take 240 mg by mouth daily.      ferrous sulfate 325 (65 FE) MG tablet   Take 325 mg by mouth daily with supper.      folic acid 1 MG tablet   Commonly known as: FOLVITE   Take 1 mg by mouth every Friday.      hydrochlorothiazide 25 MG tablet   Commonly known as: HYDRODIURIL   Take 25 mg by mouth daily.      levothyroxine 100 MCG tablet   Commonly known as: SYNTHROID, LEVOTHROID   Take 100 mcg by mouth daily.      methotrexate 2.5 MG tablet   Commonly known as: RHEUMATREX   Take 5 mg by mouth See admin instructions. Pt takes 5mg  morning and 5mg  in the evening on Fridays. Caution:Chemotherapy. Protect from light.      multivitamin with minerals Tabs   Take 1 tablet by mouth daily.             SignedKaty Apo 09/04/2011, 1:34 PM

## 2011-09-07 ENCOUNTER — Other Ambulatory Visit: Payer: Self-pay | Admitting: Internal Medicine

## 2011-09-07 DIAGNOSIS — Z1231 Encounter for screening mammogram for malignant neoplasm of breast: Secondary | ICD-10-CM

## 2011-09-30 ENCOUNTER — Ambulatory Visit
Admission: RE | Admit: 2011-09-30 | Discharge: 2011-09-30 | Disposition: A | Discharge status: Home / Self Care | Payer: BC Managed Care – PPO | Source: Ambulatory Visit | Attending: Internal Medicine | Admitting: Internal Medicine

## 2011-09-30 DIAGNOSIS — Z1231 Encounter for screening mammogram for malignant neoplasm of breast: Secondary | ICD-10-CM

## 2012-01-04 ENCOUNTER — Other Ambulatory Visit: Payer: Self-pay | Admitting: Urology

## 2012-01-04 ENCOUNTER — Ambulatory Visit (HOSPITAL_COMMUNITY)
Admission: RE | Admit: 2012-01-04 | Discharge: 2012-01-04 | Disposition: A | Discharge status: Home / Self Care | Payer: BC Managed Care – PPO | Source: Ambulatory Visit | Attending: Urology | Admitting: Urology

## 2012-01-04 DIAGNOSIS — C649 Malignant neoplasm of unspecified kidney, except renal pelvis: Secondary | ICD-10-CM | POA: Insufficient documentation

## 2012-08-24 ENCOUNTER — Other Ambulatory Visit: Payer: Self-pay

## 2012-08-24 DIAGNOSIS — Z1231 Encounter for screening mammogram for malignant neoplasm of breast: Secondary | ICD-10-CM

## 2012-09-30 ENCOUNTER — Ambulatory Visit
Admission: RE | Admit: 2012-09-30 | Discharge: 2012-09-30 | Disposition: A | Discharge status: Home / Self Care | Payer: BC Managed Care – PPO | Source: Ambulatory Visit

## 2012-09-30 DIAGNOSIS — Z1231 Encounter for screening mammogram for malignant neoplasm of breast: Secondary | ICD-10-CM

## 2013-01-04 ENCOUNTER — Ambulatory Visit (HOSPITAL_COMMUNITY)
Admission: RE | Admit: 2013-01-04 | Discharge: 2013-01-04 | Disposition: A | Discharge status: Home / Self Care | Payer: BC Managed Care – PPO | Source: Ambulatory Visit | Attending: Urology | Admitting: Urology

## 2013-01-04 ENCOUNTER — Other Ambulatory Visit: Payer: Self-pay | Admitting: Urology

## 2013-01-04 DIAGNOSIS — C649 Malignant neoplasm of unspecified kidney, except renal pelvis: Secondary | ICD-10-CM

## 2013-08-30 ENCOUNTER — Other Ambulatory Visit: Payer: Self-pay

## 2013-08-30 DIAGNOSIS — Z1231 Encounter for screening mammogram for malignant neoplasm of breast: Secondary | ICD-10-CM

## 2013-10-02 ENCOUNTER — Ambulatory Visit
Admission: RE | Admit: 2013-10-02 | Discharge: 2013-10-02 | Disposition: A | Discharge status: Home / Self Care | Payer: BC Managed Care – PPO | Source: Ambulatory Visit

## 2013-10-02 ENCOUNTER — Encounter (INDEPENDENT_AMBULATORY_CARE_PROVIDER_SITE_OTHER): Payer: Self-pay

## 2013-10-02 DIAGNOSIS — Z1231 Encounter for screening mammogram for malignant neoplasm of breast: Secondary | ICD-10-CM

## 2013-10-13 ENCOUNTER — Other Ambulatory Visit (HOSPITAL_COMMUNITY): Payer: Self-pay | Admitting: Internal Medicine

## 2013-10-13 ENCOUNTER — Ambulatory Visit (HOSPITAL_COMMUNITY)
Admission: RE | Admit: 2013-10-13 | Discharge: 2013-10-13 | Disposition: A | Discharge status: Home / Self Care | Payer: BC Managed Care – PPO | Source: Ambulatory Visit | Attending: Internal Medicine | Admitting: Internal Medicine

## 2013-10-13 DIAGNOSIS — R609 Edema, unspecified: Secondary | ICD-10-CM

## 2013-10-13 NOTE — Progress Notes (Addendum)
*  PRELIMINARY RESULTS* Vascular Ultrasound Right lower extremity venous duplex has been completed.  Preliminary findings: No evidence of DVT or baker's cyst.  Called results to [facility].  Landry Mellow, RDMS, RVT  10/13/2013, 12:48 PM

## 2014-01-05 ENCOUNTER — Other Ambulatory Visit (HOSPITAL_COMMUNITY): Payer: Self-pay | Admitting: Urology

## 2014-01-05 ENCOUNTER — Other Ambulatory Visit: Payer: Self-pay | Admitting: Urology

## 2014-01-05 ENCOUNTER — Ambulatory Visit (HOSPITAL_COMMUNITY)
Admission: RE | Admit: 2014-01-05 | Discharge: 2014-01-05 | Disposition: A | Discharge status: Home / Self Care | Payer: BC Managed Care – PPO | Source: Ambulatory Visit | Attending: Urology | Admitting: Urology

## 2014-01-05 DIAGNOSIS — I10 Essential (primary) hypertension: Secondary | ICD-10-CM | POA: Insufficient documentation

## 2014-01-05 DIAGNOSIS — C649 Malignant neoplasm of unspecified kidney, except renal pelvis: Secondary | ICD-10-CM | POA: Diagnosis not present

## 2014-09-03 ENCOUNTER — Other Ambulatory Visit: Payer: Self-pay

## 2014-09-03 DIAGNOSIS — Z1231 Encounter for screening mammogram for malignant neoplasm of breast: Secondary | ICD-10-CM

## 2014-10-16 ENCOUNTER — Ambulatory Visit
Admission: RE | Admit: 2014-10-16 | Discharge: 2014-10-16 | Disposition: A | Discharge status: Home / Self Care | Payer: BLUE CROSS/BLUE SHIELD | Source: Ambulatory Visit

## 2014-10-16 DIAGNOSIS — Z1231 Encounter for screening mammogram for malignant neoplasm of breast: Secondary | ICD-10-CM

## 2014-10-17 ENCOUNTER — Other Ambulatory Visit: Payer: Self-pay | Admitting: Internal Medicine

## 2014-10-17 DIAGNOSIS — R928 Other abnormal and inconclusive findings on diagnostic imaging of breast: Secondary | ICD-10-CM

## 2014-10-23 ENCOUNTER — Ambulatory Visit
Admission: RE | Admit: 2014-10-23 | Discharge: 2014-10-23 | Disposition: A | Discharge status: Home / Self Care | Payer: BLUE CROSS/BLUE SHIELD | Source: Ambulatory Visit | Attending: Internal Medicine | Admitting: Internal Medicine

## 2014-10-23 DIAGNOSIS — R928 Other abnormal and inconclusive findings on diagnostic imaging of breast: Secondary | ICD-10-CM

## 2015-02-18 ENCOUNTER — Ambulatory Visit (HOSPITAL_COMMUNITY)
Admission: RE | Admit: 2015-02-18 | Discharge: 2015-02-18 | Disposition: A | Discharge status: Home / Self Care | Payer: BLUE CROSS/BLUE SHIELD | Source: Ambulatory Visit | Attending: Urology | Admitting: Urology

## 2015-02-18 ENCOUNTER — Other Ambulatory Visit: Payer: Self-pay | Admitting: Urology

## 2015-02-18 DIAGNOSIS — C649 Malignant neoplasm of unspecified kidney, except renal pelvis: Secondary | ICD-10-CM | POA: Diagnosis present

## 2015-05-02 ENCOUNTER — Other Ambulatory Visit: Payer: Self-pay | Admitting: Orthopaedic Surgery

## 2015-05-27 NOTE — H&P (Signed)
TOTAL KNEE ADMISSION H&P  Patient is being admitted for right total knee arthroplasty.  Subjective:  Chief Complaint:right knee pain.  HPI: Melissa Henry, 65 y.o. female, has a history of pain and functional disability in the right knee due to arthritis and has failed non-surgical conservative treatments for greater than 12 weeks to includeNSAID's and/or analgesics, corticosteriod injections, flexibility and strengthening excercises, weight reduction as appropriate and activity modification.  Onset of symptoms was gradual, starting 5 years ago with gradually worsening course since that time. The patient noted no past surgery on the right knee(s).  Patient currently rates pain in the right knee(s) at 10 out of 10 with activity. Patient has night pain, worsening of pain with activity and weight bearing, pain that interferes with activities of daily living, crepitus and joint swelling.  Patient has evidence of subchondral cysts, subchondral sclerosis, periarticular osteophytes and joint space narrowing by imaging studies. There is no active infection.  Patient Active Problem List   Diagnosis Date Noted  . Lower GI bleed 09/03/2011  . Diverticulosis 09/03/2011  . Hypertension 09/03/2011  . Hypothyroid 09/03/2011   Past Medical History  Diagnosis Date  . Thyroid disease   . Psoriasis   . Hypertension   . Heart murmur   . Cancer     Past Surgical History  Procedure Laterality Date  . Nephrectomy    . Cesarean section      No prescriptions prior to admission   No Known Allergies  Social History  Substance Use Topics  . Smoking status: Never Smoker   . Smokeless tobacco: Not on file  . Alcohol Use: No    No family history on file.   Review of Systems  Musculoskeletal: Positive for joint pain.       Right knee  All other systems reviewed and are negative.   Objective:  Physical Exam  Constitutional: She is oriented to person, place, and time. She appears well-developed and  well-nourished.  HENT:  Head: Normocephalic and atraumatic.  Eyes: Conjunctivae are normal. Pupils are equal, round, and reactive to light.  Neck: Normal range of motion.  Cardiovascular: Normal rate and regular rhythm.   Respiratory: Effort normal.  GI: Soft.  Musculoskeletal:  Right knee motion is 0-105. She has no effusion and no deformity. She has medial greater than lateral joint line pain with crepitation.  Hip motion is full and pain free and SLR is negative on both sides.  There is no palpable LAD behind either knee.  Sensation and motor function are intact on both sides and there are palpable pulses on both sides.    Neurological: She is alert and oriented to person, place, and time.  Skin: Skin is warm and dry.  Psychiatric: She has a normal mood and affect. Her behavior is normal. Judgment and thought content normal.    Vital signs in last 24 hours:    Labs:   Estimated body mass index is 32.80 kg/(m^2) as calculated from the following:   Height as of 09/03/11: 5\' 2"  (1.575 m).   Weight as of 09/02/11: 81.36 kg (179 lb 5.9 oz).   Imaging Review Plain radiographs demonstrate severe degenerative joint disease of the right knee(s). The overall alignment isneutral. The bone quality appears to be good for age and reported activity level.  Assessment/Plan:  End stage primary arthritis, right knee   The patient history, physical examination, clinical judgment of the provider and imaging studies are consistent with end stage degenerative joint disease of the  right knee(s) and total knee arthroplasty is deemed medically necessary. The treatment options including medical management, injection therapy arthroscopy and arthroplasty were discussed at length. The risks and benefits of total knee arthroplasty were presented and reviewed. The risks due to aseptic loosening, infection, stiffness, patella tracking problems, thromboembolic complications and other imponderables were discussed.  The patient acknowledged the explanation, agreed to proceed with the plan and consent was signed. Patient is being admitted for inpatient treatment for surgery, pain control, PT, OT, prophylactic antibiotics, VTE prophylaxis, progressive ambulation and ADL's and discharge planning. The patient is planning to be discharged home with home health services

## 2015-05-30 NOTE — Pre-Procedure Instructions (Signed)
Melissa Henry  05/30/2015      [id number] [address] [place] [address] Phone: [phone] Fax: [phone]    Your procedure is scheduled on Tues, Mar 21 @ 7:30 AM  Report to [facility] Admitting at 5:30 AM  Call this number if you have problems the morning of surgery:  [phone]   Remember:  Do not eat food or drink liquids after midnight.  Take these medicines the morning of surgery with A SIP OF WATER Diltiazem(Tiazac) and Levothyroxine(Synthroid)              Stop taking your Aspirin.No Goody's,BC's,Aleve,Advil,Motrin,Ibuprofen,Fish Oil,or any Herbal Medications.    Do not wear jewelry, make-up or nail polish.  Do not wear lotions, powders, or perfumes.  You may wear deodorant.  Do not shave 48 hours prior to surgery.    Do not bring valuables to the hospital.  [facility] is not responsible for any belongings or valuables.  Contacts, dentures or bridgework may not be worn into surgery.  Leave your suitcase in the car.  After surgery it may be brought to your room.  For patients admitted to the hospital, discharge time will be determined by your treatment team.  Patients discharged the day of surgery will not be allowed to drive home.    Special instructions:  [facility] - Preparing for Surgery  Before surgery, you can play an important role.  Because skin is not sterile, your skin needs to be as free of germs as possible.  You can reduce the number of germs on you skin by washing with CHG (chlorahexidine gluconate) soap before surgery.  CHG is an antiseptic cleaner which kills germs and bonds with the skin to continue killing germs even after washing.  Please DO NOT use if you have an allergy to CHG or antibacterial soaps.  If your skin becomes reddened/irritated stop using the CHG and inform your nurse when you arrive at Short Stay.  Do not shave (including legs and underarms) for  at least 48 hours prior to the first CHG shower.  You may shave your face.  Please follow these instructions carefully:   1.  Shower with CHG Soap the night before surgery and the                                morning of Surgery.  2.  If you choose to wash your hair, wash your hair first as usual with your       normal shampoo.  3.  After you shampoo, rinse your hair and body thoroughly to remove the                      Shampoo.  4.  Use CHG as you would any other liquid soap.  You can apply chg directly       to the skin and wash gently with scrungie or a clean washcloth.  5.  Apply the CHG Soap to your body ONLY FROM THE NECK DOWN.        Do not use on open wounds or open sores.  Avoid contact with your eyes,       ears, mouth and genitals (private parts).  Wash genitals (private parts)       with your normal soap.  6.  Wash thoroughly, paying special attention  to the area where your surgery        will be performed.  7.  Thoroughly rinse your body with warm water from the neck down.  8.  DO NOT shower/wash with your normal soap after using and rinsing off       the CHG Soap.  9.  Pat yourself dry with a clean towel.            10.  Wear clean pajamas.            11.  Place clean sheets on your bed the night of your first shower and do not        sleep with pets.  Day of Surgery  Do not apply any lotions/deoderants the morning of surgery.  Please wear clean clothes to the hospital/surgery center.    Please read over the following fact sheets that you were given. Pain Booklet, Coughing and Deep Breathing, Blood Transfusion Information, MRSA Information and Surgical Site Infection Prevention

## 2015-05-31 ENCOUNTER — Encounter (HOSPITAL_COMMUNITY)
Admission: RE | Admit: 2015-05-31 | Discharge: 2015-05-31 | Disposition: A | Discharge status: Home / Self Care | Payer: Medicare Other | Source: Ambulatory Visit | Attending: Orthopaedic Surgery | Admitting: Orthopaedic Surgery

## 2015-05-31 ENCOUNTER — Encounter (HOSPITAL_COMMUNITY): Payer: Self-pay

## 2015-05-31 DIAGNOSIS — M1711 Unilateral primary osteoarthritis, right knee: Secondary | ICD-10-CM | POA: Insufficient documentation

## 2015-05-31 DIAGNOSIS — Z01818 Encounter for other preprocedural examination: Secondary | ICD-10-CM | POA: Diagnosis present

## 2015-05-31 DIAGNOSIS — L409 Psoriasis, unspecified: Secondary | ICD-10-CM | POA: Diagnosis not present

## 2015-05-31 DIAGNOSIS — Z01812 Encounter for preprocedural laboratory examination: Secondary | ICD-10-CM | POA: Insufficient documentation

## 2015-05-31 DIAGNOSIS — Z0183 Encounter for blood typing: Secondary | ICD-10-CM | POA: Diagnosis not present

## 2015-05-31 DIAGNOSIS — E039 Hypothyroidism, unspecified: Secondary | ICD-10-CM | POA: Diagnosis not present

## 2015-05-31 DIAGNOSIS — I10 Essential (primary) hypertension: Secondary | ICD-10-CM | POA: Diagnosis not present

## 2015-05-31 HISTORY — DX: Effusion, unspecified joint: M25.40

## 2015-05-31 HISTORY — DX: Hyperlipidemia, unspecified: E78.5

## 2015-05-31 HISTORY — DX: Personal history of other diseases of the respiratory system: Z87.09

## 2015-05-31 HISTORY — DX: Pain in unspecified joint: M25.50

## 2015-05-31 HISTORY — DX: Other specified postprocedural states: Z98.890

## 2015-05-31 HISTORY — DX: Frequency of micturition: R35.0

## 2015-05-31 HISTORY — DX: Other reaction to spinal and lumbar puncture: G97.1

## 2015-05-31 HISTORY — DX: Diverticulosis of intestine, part unspecified, without perforation or abscess without bleeding: K57.90

## 2015-05-31 HISTORY — DX: Nausea with vomiting, unspecified: R11.2

## 2015-05-31 HISTORY — DX: Anemia, unspecified: D64.9

## 2015-05-31 HISTORY — DX: Hypothyroidism, unspecified: E03.9

## 2015-05-31 LAB — BASIC METABOLIC PANEL
Anion gap: 12 (ref 5–15)
BUN: 18 mg/dL (ref 6–20)
CO2: 26 mmol/L (ref 22–32)
CREATININE: 0.75 mg/dL (ref 0.44–1.00)
Calcium: 10.1 mg/dL (ref 8.9–10.3)
Chloride: 103 mmol/L (ref 101–111)
GFR calc Af Amer: >60 mL/min (ref >60)
Glucose, Bld: 105 mg/dL — ABNORMAL HIGH (ref 65–99)
POTASSIUM: 3.9 mmol/L (ref 3.5–5.1)
SODIUM: 141 mmol/L (ref 135–145)

## 2015-05-31 LAB — PROTIME-INR
INR: 1.11 (ref 0.00–1.49)
PROTHROMBIN TIME: 14.5 s (ref 11.6–15.2)

## 2015-05-31 LAB — TYPE AND SCREEN
ABO/RH(D): A POS
ANTIBODY SCREEN: NEGATIVE

## 2015-05-31 LAB — CBC WITH DIFFERENTIAL/PLATELET
BASOS ABS: 0 10*3/uL (ref 0.0–0.1)
BASOS PCT: 1 %
EOS ABS: 0.2 10*3/uL (ref 0.0–0.7)
EOS PCT: 4 %
HCT: 43.5 % (ref 36.0–46.0)
Hemoglobin: 14.2 g/dL (ref 12.0–15.0)
Lymphocytes Relative: 17 %
Lymphs Abs: 1 10*3/uL (ref 0.7–4.0)
MCH: 30.1 pg (ref 26.0–34.0)
MCHC: 32.6 g/dL (ref 30.0–36.0)
MCV: 92.4 fL (ref 78.0–100.0)
Monocytes Absolute: 0.6 10*3/uL (ref 0.1–1.0)
Monocytes Relative: 10 %
Neutro Abs: 3.9 10*3/uL (ref 1.7–7.7)
Neutrophils Relative %: 68 %
PLATELETS: 191 10*3/uL (ref 150–400)
RBC: 4.71 MIL/uL (ref 3.87–5.11)
RDW: 13.8 % (ref 11.5–15.5)
WBC: 5.7 10*3/uL (ref 4.0–10.5)

## 2015-05-31 LAB — URINALYSIS, ROUTINE W REFLEX MICROSCOPIC
BILIRUBIN URINE: NEGATIVE
Glucose, UA: NEGATIVE mg/dL
HGB URINE DIPSTICK: NEGATIVE
Ketones, ur: NEGATIVE mg/dL
Leukocytes, UA: NEGATIVE
Nitrite: NEGATIVE
PH: 6 (ref 5.0–8.0)
Protein, ur: NEGATIVE mg/dL
SPECIFIC GRAVITY, URINE: 1.006 (ref 1.005–1.030)

## 2015-05-31 LAB — SURGICAL PCR SCREEN
MRSA, PCR: NEGATIVE
Staphylococcus aureus: POSITIVE — AB

## 2015-05-31 LAB — APTT: aPTT: 27 seconds (ref 24–37)

## 2015-05-31 MED ORDER — CHLORHEXIDINE GLUCONATE 4 % EX LIQD: 60.0000 mL | Freq: Once | CUTANEOUS | Status: DC

## 2015-05-31 NOTE — Progress Notes (Signed)
[facility] [address]

## 2015-05-31 NOTE — Progress Notes (Addendum)
Cardiologist denies  Medical Md is Dr.Ronald Polite  Echo 20+ yrs ago  Stress test 20+ yrs ago  Heart cath report in epic from 2009  EKG denies in past yr  CXR in epic from 02-18-15

## 2015-06-10 MED ORDER — LACTATED RINGERS IV SOLN
INTRAVENOUS | Status: DC
Start: 2015-06-11 — End: 2015-06-11
  Administered 2015-06-11 (×2): via INTRAVENOUS

## 2015-06-10 MED ORDER — CEFAZOLIN SODIUM-DEXTROSE 2-3 GM-% IV SOLR
2.0000 g | INTRAVENOUS | Status: AC
  Administered 2015-06-11: 2 g via INTRAVENOUS
  Filled 2015-06-10: qty 50

## 2015-06-11 ENCOUNTER — Inpatient Hospital Stay (HOSPITAL_COMMUNITY)
Admission: RE | Admit: 2015-06-11 | Discharge: 2015-06-13 | DRG: 470 | Disposition: A | Discharge status: Home / Self Care | Payer: Medicare Other | Source: Ambulatory Visit | Attending: Orthopaedic Surgery | Admitting: Orthopaedic Surgery

## 2015-06-11 ENCOUNTER — Encounter (HOSPITAL_COMMUNITY)
Admission: RE | Disposition: A | Discharge status: Home / Self Care | Payer: Self-pay | Source: Ambulatory Visit | Attending: Orthopaedic Surgery

## 2015-06-11 ENCOUNTER — Inpatient Hospital Stay (HOSPITAL_COMMUNITY): Payer: Medicare Other | Admitting: Anesthesiology

## 2015-06-11 ENCOUNTER — Encounter (HOSPITAL_COMMUNITY): Payer: Self-pay | Admitting: *Deleted

## 2015-06-11 DIAGNOSIS — I10 Essential (primary) hypertension: Secondary | ICD-10-CM | POA: Diagnosis present

## 2015-06-11 DIAGNOSIS — E785 Hyperlipidemia, unspecified: Secondary | ICD-10-CM | POA: Diagnosis present

## 2015-06-11 DIAGNOSIS — E039 Hypothyroidism, unspecified: Secondary | ICD-10-CM | POA: Diagnosis present

## 2015-06-11 DIAGNOSIS — M1711 Unilateral primary osteoarthritis, right knee: Principal | ICD-10-CM | POA: Diagnosis present

## 2015-06-11 DIAGNOSIS — L409 Psoriasis, unspecified: Secondary | ICD-10-CM | POA: Diagnosis present

## 2015-06-11 DIAGNOSIS — D649 Anemia, unspecified: Secondary | ICD-10-CM | POA: Diagnosis present

## 2015-06-11 DIAGNOSIS — Z905 Acquired absence of kidney: Secondary | ICD-10-CM

## 2015-06-11 DIAGNOSIS — Z79899 Other long term (current) drug therapy: Secondary | ICD-10-CM

## 2015-06-11 HISTORY — PX: TOTAL KNEE ARTHROPLASTY: SHX125

## 2015-06-11 SURGERY — ARTHROPLASTY, KNEE, TOTAL
Anesthesia: Monitor Anesthesia Care | Laterality: Right

## 2015-06-11 MED ORDER — PROPOFOL 10 MG/ML IV BOLUS
INTRAVENOUS | Status: AC
  Filled 2015-06-11: qty 20

## 2015-06-11 MED ORDER — CEFAZOLIN SODIUM-DEXTROSE 2-3 GM-% IV SOLR
2.0000 g | Freq: Four times a day (QID) | INTRAVENOUS | Status: AC
  Administered 2015-06-11 (×2): 2 g via INTRAVENOUS
  Filled 2015-06-11 (×2): qty 50

## 2015-06-11 MED ORDER — DOCUSATE SODIUM 100 MG PO CAPS
100.0000 mg | ORAL_CAPSULE | Freq: Two times a day (BID) | ORAL | Status: DC
  Administered 2015-06-11 – 2015-06-13 (×5): 100 mg via ORAL
  Filled 2015-06-11 (×5): qty 1

## 2015-06-11 MED ORDER — HYDROMORPHONE HCL 1 MG/ML IJ SOLN: 0.5000 mg | INTRAMUSCULAR | Status: DC | PRN

## 2015-06-11 MED ORDER — LEVOTHYROXINE SODIUM 100 MCG PO TABS
100.0000 ug | ORAL_TABLET | Freq: Every day | ORAL | Status: DC
  Administered 2015-06-12 – 2015-06-13 (×2): 100 ug via ORAL
  Filled 2015-06-11 (×2): qty 1

## 2015-06-11 MED ORDER — MENTHOL 3 MG MT LOZG: 1.0000 | LOZENGE | OROMUCOSAL | Status: DC | PRN

## 2015-06-11 MED ORDER — ONDANSETRON HCL 4 MG/2ML IJ SOLN
4.0000 mg | Freq: Four times a day (QID) | INTRAMUSCULAR | Status: DC | PRN
  Administered 2015-06-11: 4 mg via INTRAVENOUS
  Filled 2015-06-11: qty 2

## 2015-06-11 MED ORDER — DILTIAZEM HCL ER BEADS 240 MG PO CP24
240.0000 mg | ORAL_CAPSULE | Freq: Every day | ORAL | Status: DC
  Administered 2015-06-12 – 2015-06-13 (×2): 240 mg via ORAL
  Filled 2015-06-11 (×4): qty 1

## 2015-06-11 MED ORDER — METHOTREXATE 2.5 MG PO TABS: 5.0000 mg | ORAL_TABLET | ORAL | Status: DC

## 2015-06-11 MED ORDER — METOCLOPRAMIDE HCL 5 MG/ML IJ SOLN: 5.0000 mg | Freq: Three times a day (TID) | INTRAMUSCULAR | Status: DC | PRN

## 2015-06-11 MED ORDER — ONDANSETRON HCL 4 MG/2ML IJ SOLN: 4.0000 mg | Freq: Once | INTRAMUSCULAR | Status: DC | PRN

## 2015-06-11 MED ORDER — ASPIRIN EC 325 MG PO TBEC
325.0000 mg | DELAYED_RELEASE_TABLET | Freq: Two times a day (BID) | ORAL | Status: DC
  Administered 2015-06-12 – 2015-06-13 (×3): 325 mg via ORAL
  Filled 2015-06-11 (×3): qty 1

## 2015-06-11 MED ORDER — ACETAMINOPHEN 650 MG RE SUPP: 650.0000 mg | Freq: Four times a day (QID) | RECTAL | Status: DC | PRN

## 2015-06-11 MED ORDER — DIPHENHYDRAMINE HCL 12.5 MG/5ML PO ELIX: 12.5000 mg | ORAL_SOLUTION | ORAL | Status: DC | PRN

## 2015-06-11 MED ORDER — FENTANYL CITRATE (PF) 100 MCG/2ML IJ SOLN
INTRAMUSCULAR | Status: DC | PRN
  Administered 2015-06-11 (×2): 25 ug via INTRAVENOUS
  Administered 2015-06-11: 50 ug via INTRAVENOUS
  Administered 2015-06-11 (×2): 25 ug via INTRAVENOUS

## 2015-06-11 MED ORDER — ACETAMINOPHEN 325 MG PO TABS: 650.0000 mg | ORAL_TABLET | Freq: Four times a day (QID) | ORAL | Status: DC | PRN

## 2015-06-11 MED ORDER — PROPOFOL 500 MG/50ML IV EMUL
INTRAVENOUS | Status: DC | PRN
  Administered 2015-06-11: 100 ug/kg/min via INTRAVENOUS

## 2015-06-11 MED ORDER — HYDROMORPHONE HCL 1 MG/ML IJ SOLN: 0.2500 mg | INTRAMUSCULAR | Status: DC | PRN

## 2015-06-11 MED ORDER — METHOCARBAMOL 1000 MG/10ML IJ SOLN: 500.0000 mg | Freq: Four times a day (QID) | INTRAMUSCULAR | Status: DC | PRN

## 2015-06-11 MED ORDER — TRANEXAMIC ACID 1000 MG/10ML IV SOLN
1000.0000 mg | INTRAVENOUS | Status: AC
  Administered 2015-06-11: 1000 mg via INTRAVENOUS
  Filled 2015-06-11: qty 10

## 2015-06-11 MED ORDER — SODIUM CHLORIDE 0.9 % IR SOLN
Status: DC | PRN
  Administered 2015-06-11: 1000 mL

## 2015-06-11 MED ORDER — MIDAZOLAM HCL 2 MG/2ML IJ SOLN
INTRAMUSCULAR | Status: AC
  Filled 2015-06-11: qty 2

## 2015-06-11 MED ORDER — FENTANYL CITRATE (PF) 250 MCG/5ML IJ SOLN
INTRAMUSCULAR | Status: AC
  Filled 2015-06-11: qty 5

## 2015-06-11 MED ORDER — BISACODYL 5 MG PO TBEC: 5.0000 mg | DELAYED_RELEASE_TABLET | Freq: Every day | ORAL | Status: DC | PRN

## 2015-06-11 MED ORDER — ALUM & MAG HYDROXIDE-SIMETH 200-200-20 MG/5ML PO SUSP: 30.0000 mL | ORAL | Status: DC | PRN

## 2015-06-11 MED ORDER — HYDROCHLOROTHIAZIDE 25 MG PO TABS
25.0000 mg | ORAL_TABLET | Freq: Every day | ORAL | Status: DC
  Administered 2015-06-11 – 2015-06-13 (×3): 25 mg via ORAL
  Filled 2015-06-11 (×3): qty 1

## 2015-06-11 MED ORDER — METOCLOPRAMIDE HCL 5 MG PO TABS: 5.0000 mg | ORAL_TABLET | Freq: Three times a day (TID) | ORAL | Status: DC | PRN

## 2015-06-11 MED ORDER — 0.9 % SODIUM CHLORIDE (POUR BTL) OPTIME
TOPICAL | Status: DC | PRN
  Administered 2015-06-11: 1000 mL

## 2015-06-11 MED ORDER — BUPIVACAINE-EPINEPHRINE (PF) 0.5% -1:200000 IJ SOLN
INTRAMUSCULAR | Status: AC
  Filled 2015-06-11: qty 30

## 2015-06-11 MED ORDER — BUPIVACAINE-EPINEPHRINE (PF) 0.5% -1:200000 IJ SOLN
INTRAMUSCULAR | Status: DC | PRN
  Administered 2015-06-11: 20 mL via PERINEURAL

## 2015-06-11 MED ORDER — LIDOCAINE HCL (CARDIAC) 20 MG/ML IV SOLN
INTRAVENOUS | Status: AC
  Filled 2015-06-11: qty 5

## 2015-06-11 MED ORDER — OXYCODONE HCL 5 MG/5ML PO SOLN: 5.0000 mg | Freq: Once | ORAL | Status: DC | PRN

## 2015-06-11 MED ORDER — MIDAZOLAM HCL 5 MG/5ML IJ SOLN
INTRAMUSCULAR | Status: DC | PRN
Start: 2015-06-11 — End: 2015-06-11
  Administered 2015-06-11: 1 mg via INTRAVENOUS

## 2015-06-11 MED ORDER — ONDANSETRON HCL 4 MG/2ML IJ SOLN
INTRAMUSCULAR | Status: DC | PRN
  Administered 2015-06-11: 4 mg via INTRAVENOUS

## 2015-06-11 MED ORDER — HYDROCODONE-ACETAMINOPHEN 5-325 MG PO TABS
1.0000 | ORAL_TABLET | ORAL | Status: DC | PRN
  Administered 2015-06-11 – 2015-06-12 (×6): 2 via ORAL
  Administered 2015-06-12: 1 via ORAL
  Administered 2015-06-12 – 2015-06-13 (×2): 2 via ORAL
  Administered 2015-06-13: 1 via ORAL
  Administered 2015-06-13: 2 via ORAL
  Filled 2015-06-11 (×4): qty 2
  Filled 2015-06-11: qty 1
  Filled 2015-06-11 (×2): qty 2
  Filled 2015-06-11: qty 1
  Filled 2015-06-11 (×3): qty 2

## 2015-06-11 MED ORDER — ATORVASTATIN CALCIUM 10 MG PO TABS
10.0000 mg | ORAL_TABLET | Freq: Every day | ORAL | Status: DC
  Administered 2015-06-11 – 2015-06-13 (×3): 10 mg via ORAL
  Filled 2015-06-11 (×3): qty 1

## 2015-06-11 MED ORDER — METHOCARBAMOL 500 MG PO TABS
500.0000 mg | ORAL_TABLET | Freq: Four times a day (QID) | ORAL | Status: DC | PRN
  Administered 2015-06-11 – 2015-06-13 (×6): 500 mg via ORAL
  Filled 2015-06-11 (×6): qty 1

## 2015-06-11 MED ORDER — OXYCODONE HCL 5 MG PO TABS: 5.0000 mg | ORAL_TABLET | Freq: Once | ORAL | Status: DC | PRN

## 2015-06-11 MED ORDER — LACTATED RINGERS IV SOLN: INTRAVENOUS | Status: DC

## 2015-06-11 MED ORDER — PHENOL 1.4 % MT LIQD: 1.0000 | OROMUCOSAL | Status: DC | PRN

## 2015-06-11 MED ORDER — ONDANSETRON HCL 4 MG PO TABS: 4.0000 mg | ORAL_TABLET | Freq: Four times a day (QID) | ORAL | Status: DC | PRN

## 2015-06-11 MED ORDER — BUPIVACAINE LIPOSOME 1.3 % IJ SUSP
20.0000 mL | INTRAMUSCULAR | Status: AC
  Administered 2015-06-11: 20 mL
  Filled 2015-06-11: qty 20

## 2015-06-11 SURGICAL SUPPLY — 66 items
BANDAGE ELASTIC 4 VELCRO ST LF (GAUZE/BANDAGES/DRESSINGS) ×2 IMPLANT
BANDAGE ESMARK 6X9 LF (GAUZE/BANDAGES/DRESSINGS) ×1 IMPLANT
BLADE SAGITTAL 25.0X1.19X90 (BLADE) ×1 IMPLANT
BLADE SAW SGTL 13.0X1.19X90.0M (BLADE) IMPLANT
BLADE SURG ROTATE 9660 (MISCELLANEOUS) IMPLANT
BNDG CMPR 9X6 STRL LF SNTH (GAUZE/BANDAGES/DRESSINGS) ×1
BNDG CMPR MED 10X6 ELC LF (GAUZE/BANDAGES/DRESSINGS) ×1
BNDG ELASTIC 6X10 VLCR STRL LF (GAUZE/BANDAGES/DRESSINGS) ×2 IMPLANT
BNDG ESMARK 6X9 LF (GAUZE/BANDAGES/DRESSINGS) ×2
BNDG GAUZE ELAST 4 BULKY (GAUZE/BANDAGES/DRESSINGS) ×3 IMPLANT
BOWL SMART MIX CTS (DISPOSABLE) ×2 IMPLANT
CAP KNEE TOTAL 3 SIGMA ×1 IMPLANT
CEMENT HV SMART SET (Cement) ×4 IMPLANT
COVER SURGICAL LIGHT HANDLE (MISCELLANEOUS) ×2 IMPLANT
CUFF TOURNIQUET SINGLE 34IN LL (TOURNIQUET CUFF) ×2 IMPLANT
DECANTER SPIKE VIAL GLASS SM (MISCELLANEOUS) ×1 IMPLANT
DRAPE EXTREMITY T 121X128X90 (DRAPE) ×2 IMPLANT
DRAPE PROXIMA HALF (DRAPES) ×2 IMPLANT
DRAPE U-SHAPE 47X51 STRL (DRAPES) ×2 IMPLANT
DRSG ADAPTIC 3X8 NADH LF (GAUZE/BANDAGES/DRESSINGS) ×2 IMPLANT
DRSG PAD ABDOMINAL 8X10 ST (GAUZE/BANDAGES/DRESSINGS) ×2 IMPLANT
DURAPREP 26ML APPLICATOR (WOUND CARE) ×2 IMPLANT
ELECT REM PT RETURN 9FT ADLT (ELECTROSURGICAL) ×2
ELECTRODE REM PT RTRN 9FT ADLT (ELECTROSURGICAL) ×1 IMPLANT
GAUZE SPONGE 4X4 12PLY STRL (GAUZE/BANDAGES/DRESSINGS) ×2 IMPLANT
GLOVE BIO SURGEON STRL SZ 6.5 (GLOVE) ×1 IMPLANT
GLOVE BIO SURGEON STRL SZ8 (GLOVE) ×4 IMPLANT
GLOVE BIOGEL M STRL SZ7.5 (GLOVE) ×1 IMPLANT
GLOVE BIOGEL PI IND STRL 7.0 (GLOVE) IMPLANT
GLOVE BIOGEL PI IND STRL 7.5 (GLOVE) IMPLANT
GLOVE BIOGEL PI IND STRL 8 (GLOVE) ×2 IMPLANT
GLOVE BIOGEL PI INDICATOR 7.0 (GLOVE) ×1
GLOVE BIOGEL PI INDICATOR 7.5 (GLOVE) ×1
GLOVE BIOGEL PI INDICATOR 8 (GLOVE) ×2
GOWN STRL REUS W/ TWL LRG LVL3 (GOWN DISPOSABLE) ×1 IMPLANT
GOWN STRL REUS W/ TWL XL LVL3 (GOWN DISPOSABLE) ×2 IMPLANT
GOWN STRL REUS W/TWL LRG LVL3 (GOWN DISPOSABLE) ×2
GOWN STRL REUS W/TWL XL LVL3 (GOWN DISPOSABLE) ×6
HANDPIECE INTERPULSE COAX TIP (DISPOSABLE) ×2
HOOD PEEL AWAY FACE SHEILD DIS (HOOD) ×4 IMPLANT
IMMOBILIZER KNEE 22 UNIV (SOFTGOODS) ×2 IMPLANT
KIT BASIN OR (CUSTOM PROCEDURE TRAY) ×2 IMPLANT
KIT ROOM TURNOVER OR (KITS) ×2 IMPLANT
MANIFOLD NEPTUNE II (INSTRUMENTS) ×2 IMPLANT
NDL HYPO 21X1 ECLIPSE (NEEDLE) ×1 IMPLANT
NEEDLE HYPO 21X1 ECLIPSE (NEEDLE) ×2 IMPLANT
NS IRRIG 1000ML POUR BTL (IV SOLUTION) ×2 IMPLANT
PACK TOTAL JOINT (CUSTOM PROCEDURE TRAY) ×2 IMPLANT
PACK UNIVERSAL I (CUSTOM PROCEDURE TRAY) ×2 IMPLANT
PAD ARMBOARD 7.5X6 YLW CONV (MISCELLANEOUS) ×4 IMPLANT
SET HNDPC FAN SPRY TIP SCT (DISPOSABLE) ×1 IMPLANT
STAPLER VISISTAT 35W (STAPLE) IMPLANT
STRIP CLOSURE SKIN 1/2X4 (GAUZE/BANDAGES/DRESSINGS) ×2 IMPLANT
SUCTION FRAZIER HANDLE 10FR (MISCELLANEOUS)
SUCTION TUBE FRAZIER 10FR DISP (MISCELLANEOUS) IMPLANT
SUT MNCRL AB 3-0 PS2 18 (SUTURE) IMPLANT
SUT VIC AB 0 CT1 27 (SUTURE) ×4
SUT VIC AB 0 CT1 27XBRD ANBCTR (SUTURE) ×2 IMPLANT
SUT VIC AB 2-0 CT1 27 (SUTURE) ×4
SUT VIC AB 2-0 CT1 TAPERPNT 27 (SUTURE) ×2 IMPLANT
SUT VLOC 180 0 24IN GS25 (SUTURE) ×2 IMPLANT
SYR 50ML LL SCALE MARK (SYRINGE) ×2 IMPLANT
TOWEL OR 17X24 6PK STRL BLUE (TOWEL DISPOSABLE) ×2 IMPLANT
TOWEL OR 17X26 10 PK STRL BLUE (TOWEL DISPOSABLE) ×2 IMPLANT
TRAY FOLEY CATH 14FR (SET/KITS/TRAYS/PACK) IMPLANT
WATER STERILE IRR 1000ML POUR (IV SOLUTION) ×2 IMPLANT

## 2015-06-11 NOTE — Op Note (Signed)
PREOP DIAGNOSIS: DJD RIGHT KNEE POSTOP DIAGNOSIS: same PROCEDURE: RIGHT TKR ANESTHESIA: Spinal and MAC ATTENDING SURGEON: Mkenzie Dotts Henry ASSISTANT: Loni Dolly PA  INDICATIONS FOR PROCEDURE: Melissa Henry is a 65 y.o. female who has struggled for a long time with pain due to degenerative arthritis of the right knee.  The patient has failed many conservative non-operative measures and at this point has pain which limits the ability to sleep and walk.  The patient is offered total knee replacement.  Informed operative consent was obtained after discussion of possible risks of anesthesia, infection, neurovascular injury, DVT, and death.  The importance of the post-operative rehabilitation protocol to optimize result was stressed extensively with the patient.  SUMMARY OF FINDINGS AND PROCEDURE:  Melissa Henry was taken to the operative suite where under the above anesthesia a right knee replacement was performed.  There were advanced degenerative changes and the bone quality was good.  We used the DePuy LCS system and placed size standard femur, 3 tibia, 35 mm all polyethylene patella, and a size 10 mm spacer.  Loni Dolly PA-C assisted throughout and was invaluable to the completion of the case in that he helped retract and maintain exposure while I placed components.  He also helped close thereby minimizing OR time.  The patient was admitted for appropriate post-op care to include perioperative antibiotics and mechanical and pharmacologic measures for DVT prophylaxis.  DESCRIPTION OF PROCEDURE:  Melissa Henry was taken to the operative suite where the above anesthesia was applied.  The patient was positioned supine and prepped and draped in normal sterile fashion.  An appropriate time out was performed.  After the administration of kefzol pre-op antibiotic the leg was elevated and exsanguinated and a tourniquet inflated. A standard longitudinal incision was made on the anterior knee.  Dissection  was carried down to the extensor mechanism.  All appropriate anti-infective measures were used including the pre-operative antibiotic, betadine impregnated drape, and closed hooded exhaust systems for each member of the surgical team.  A medial parapatellar incision was made in the extensor mechanism and the knee cap flipped and the knee flexed.  Some residual meniscal tissues were removed along with any remaining ACL/PCL tissue.  A guide was placed on the tibia and a flat cut was made on it's superior surface.  An intramedullary guide was placed in the femur and was utilized to make anterior and posterior cuts creating an appropriate flexion gap.  A second intramedullary guide was placed in the femur to make a distal cut properly balancing the knee with an extension gap equal to the flexion gap.  The three bones sized to the above mentioned sizes and the appropriate guides were placed and utilized.  A trial reduction was done and the knee easily came to full extension and the patella tracked well on flexion.  The trial components were removed and all bones were cleaned with pulsatile lavage and then dried thoroughly.  Cement was mixed and was pressurized onto the bones followed by placement of the aforementioned components.  Excess cement was trimmed and pressure was held on the components until the cement had hardened.  The tourniquet was deflated and a small amount of bleeding was controlled with cautery and pressure.  The knee was irrigated thoroughly.  The extensor mechanism was re-approximated with V-loc suture in running fashion.  The knee was flexed and the repair was solid.  The subcutaneous tissues were re-approximated with #0 and #2-0 vicryl and the skin closed with a  subcuticular stitch and steristrips.  A sterile dressing was applied.  Intraoperative fluids, EBL, and tourniquet time can be obtained from anesthesia records.  DISPOSITION:  The patient was taken to recovery room in stable condition and  admitted for appropriate post-op care to include peri-operative antibiotic and DVT prophylaxis with mechanical and pharmacologic measures.  Melissa Henry 06/11/2015, 9:22 AM

## 2015-06-11 NOTE — Anesthesia Procedure Notes (Signed)
Spinal Patient location during procedure: OR Start time: 06/11/2015 7:25 AM End time: 06/11/2015 7:30 AM Staffing Performed by: anesthesiologist  Preanesthetic Checklist Completed: patient identified, site marked, surgical consent, pre-op evaluation, timeout performed, IV checked, risks and benefits discussed and monitors and equipment checked Spinal Block Patient position: right lateral decubitus Prep: ChloraPrep Patient monitoring: cardiac monitor, continuous pulse ox, blood pressure and heart rate Approach: right paramedian Location: L3-4 Injection technique: single-shot Needle Needle type: Tuohy  Needle gauge: 22 G Needle length: 9 cm Assessment Sensory level: T6 Additional Notes 10 mg 0.75% bupivacaine injected easily

## 2015-06-11 NOTE — Interval H&P Note (Signed)
OK for surgery PD 

## 2015-06-11 NOTE — Progress Notes (Signed)
Orthopedic Tech Progress Note Patient Details:  Melissa Henry [birth year] [id number]  Patient ID: Melissa Henry, female   DOB: Nov 22, 1950, 65 y.o.   MRN: [id number]   [name] 06/11/2015, 10:44 Drucie Ip Immobilizer

## 2015-06-11 NOTE — Progress Notes (Signed)
Pt up to bathroom to urinate using walker with partial weight bearing.  Tolerated well.

## 2015-06-11 NOTE — Evaluation (Signed)
Occupational Therapy Evaluation Patient Details Name: Melissa Henry MRN: [id number] DOB: Jan 10, 1951 Today's Date: 06/11/2015    History of Present Illness Admitted for RTKA;  has a past medical history of Psoriasis; Hyperlipidemia; Hypothyroidism; Anemia; PONV (postoperative nausea and vomiting); Spinal headache; Hypertension; Joint swelling; Heart murmur; Cancer ([place]); Diverticulosis; and Urinary frequency.   Clinical Impression   This 65 yo female admitted and underwent above presents to acute OT with deficits below affecting her ability to be independent with basic ADLs. She will benefit from one more session of OT to address tub transfers.    Follow Up Recommendations  No OT follow up    Equipment Recommendations  None recommended by OT       Precautions / Restrictions Precautions Precautions: Knee Precaution Booklet Issued: No Precaution Comments:  Restrictions Weight Bearing Restrictions: No RLE Weight Bearing: Weight bearing as tolerated      Mobility Bed Mobility Overal bed mobility: Needs Assistance Bed Mobility: Sit to Supine     Sit to supine: Min guard    Transfers Overall transfer level: Needs assistance Equipment used: Rolling walker (2 wheeled) Transfers: Sit to/from Stand Sit to Stand: Min guard         General transfer comment: VCs for safe hand placement         ADL Overall ADL's : Needs assistance/impaired Eating/Feeding: Independent;Sitting   Grooming: Set up;Sitting   Upper Body Bathing: Set up;Sitting   Lower Body Bathing: Moderate assistance (min guard A sit<>stand)   Upper Body Dressing : Set up;Sitting   Lower Body Dressing: Moderate assistance (min guard A sit<>stand)   Toilet Transfer: Min guard;Ambulation;RW;BSC (over toilet)   Toileting- Clothing Manipulation and Hygiene: Min guard;Sit to/from stand         General ADL Comments: I educated pt on the most efficient sequence of getting dressed as well as which  leg to put in first for underwear and pants to make this process easier as well.               Pertinent Vitals/Pain Pain Assessment: 0-10 Pain Score: 8  Pain Location: right knee Pain Descriptors / Indicators: Aching;Sore Pain Intervention(s): Monitored during session;Repositioned     Hand Dominance Right   Extremity/Trunk Assessment Upper Extremity Assessment Upper Extremity Assessment: Overall WFL for tasks assessed     Communication Communication Communication: No difficulties   Cognition Arousal/Alertness: Awake/alert Behavior During Therapy: WFL for tasks assessed/performed Overall Cognitive Status: Within Functional Limits for tasks assessed                                Home Living Family/patient expects to be discharged to:: Private residence Living Arrangements: Spouse/significant other Available Help at Discharge: Family;Available 24 hours/day Type of Home: House Home Access: Stairs to enter [employer] of Steps: 3 Entrance Stairs-Rails: None Home Layout: One level     Bathroom Shower/Tub: Tub/shower unit;Curtain Shower/tub characteristics: Architectural technologist: Standard     Home Equipment: Bedside commode          Prior Functioning/Environment Level of Independence: Independent             OT Diagnosis: Generalized weakness;Acute pain   OT Problem List: Decreased strength;Impaired balance (sitting and/or standing);Pain;Decreased range of motion   OT Treatment/Interventions: Self-care/ADL training;Patient/family education;Balance training;DME and/or AE instruction    OT Goals(Current goals can be found in the care plan section) Acute Rehab OT Goals Patient Stated Goal: home  probably tomorrow OT Goal Formulation: With patient Time For Goal Achievement: 06/18/15 Potential to Achieve Goals: Good  OT Frequency: Min 2X/week              End of Session Equipment Utilized During Treatment: Gait belt;Rolling  walker  Activity Tolerance: Patient tolerated treatment well Patient left: in bed;with call bell/phone within reach;with bed alarm set   Time: 1540-1554 OT Time Calculation (min): 14 min Charges:  OT General Charges $OT Visit: 1 Procedure OT Evaluation $OT Eval Moderate Complexity: 1 Procedure  Almon Register [id number] 06/11/2015, 4:23 PM

## 2015-06-11 NOTE — Progress Notes (Signed)
Care of pt assumed by MA [facility] RN from R. Engineer, materials.

## 2015-06-11 NOTE — Progress Notes (Signed)
Orthopedic Tech Progress Note Patient Details:  Melissa Henry 08/23/1950 [id number]  CPM Right Knee CPM Right Knee: On Right Knee Flexion (Degrees): 90 Right Knee Extension (Degrees): 0 Additional Comments: Trapeze bar and knee immobizizer   [name] 06/11/2015, 10:47 AMknee Immobilizer

## 2015-06-11 NOTE — Anesthesia Preprocedure Evaluation (Addendum)
Anesthesia Evaluation  Patient identified by MRN, date of birth, ID band Patient awake    Reviewed: Allergy & Precautions, NPO status , Patient's Chart, lab work & pertinent test results  Airway Mallampati: II  TM Distance: >3 FB Neck ROM: Full    Dental  (+) Teeth Intact, Dental Advisory Given   Pulmonary    breath sounds clear to auscultation       Cardiovascular hypertension,  Rhythm:Regular Rate:Normal     Neuro/Psych    GI/Hepatic   Endo/Other    Renal/GU      Musculoskeletal   Abdominal   Peds  Hematology   Anesthesia Other Findings   Reproductive/Obstetrics                            Anesthesia Physical Anesthesia Plan  ASA: II  Anesthesia Plan: MAC and Spinal   Post-op Pain Management:    Induction:   Airway Management Planned: Natural Airway and Simple Face Mask  Additional Equipment:   Intra-op Plan:   Post-operative Plan:   Informed Consent: I have reviewed the patients History and Physical, chart, labs and discussed the procedure including the risks, benefits and alternatives for the proposed anesthesia with the patient or authorized representative who has indicated his/her understanding and acceptance.   Dental advisory given  Plan Discussed with: CRNA and Anesthesiologist  Anesthesia Plan Comments:        Anesthesia Quick Evaluation

## 2015-06-11 NOTE — Transfer of Care (Signed)
Immediate Anesthesia Transfer of Care Note  Patient: Melissa Henry  Procedure(s) Performed: Procedure(s): TOTAL KNEE ARTHROPLASTY (Right)  Patient Location: PACU  Anesthesia Type:MAC and Spinal  Level of Consciousness: awake, alert  and oriented  Airway & Oxygen Therapy: Patient Spontanous Breathing and Patient connected to face mask oxygen  Post-op Assessment: Report given to RN and Post -op Vital signs reviewed and stable  Post vital signs: Reviewed and stable  Last Vitals:  Filed Vitals:   06/11/15 0626  BP: 131/73  Pulse: 72  Temp: 36.8 C  Resp: 20    Complications: No apparent anesthesia complications

## 2015-06-11 NOTE — Care Management (Signed)
Utilization review completed. Zahir Eisenhour, RN Case Manager [phone]. 

## 2015-06-11 NOTE — Anesthesia Postprocedure Evaluation (Signed)
Anesthesia Post Note  Patient: Melissa Henry  Procedure(s) Performed: Procedure(s) (LRB): TOTAL KNEE ARTHROPLASTY (Right)  Patient location during evaluation: PACU Anesthesia Type: MAC and Spinal Level of consciousness: awake, awake and alert and oriented Pain management: pain level controlled Vital Signs Assessment: post-procedure vital signs reviewed and stable Respiratory status: spontaneous breathing and nonlabored ventilation Cardiovascular status: blood pressure returned to baseline Postop Assessment: spinal receding Anesthetic complications: no    Last Vitals:  Filed Vitals:   06/11/15 1042 06/11/15 1054  BP:  132/92  Pulse: 58 57  Temp: 36.1 C   Resp: 17 15    Last Pain:  Filed Vitals:   06/11/15 1058  PainSc: 0-No pain                 Dishawn Bhargava COKER

## 2015-06-11 NOTE — Progress Notes (Signed)
Orthopedic Tech Progress Note Patient Details:  Melissa Henry December 14, 1950 [id number] On cpm at 1930  Patient ID: [name], female   DOB: 02-08-1951, 65 y.o.   MRN: [id number]   Braulio Bosch 06/11/2015, 7:31 PM

## 2015-06-11 NOTE — Evaluation (Signed)
Physical Therapy Evaluation Patient Details Name: SCHELLY CALLERY MRN: [id number] DOB: Jul 10, 1950 Today's Date: 06/11/2015   History of Present Illness  Admitted for RTKA;  has a past medical history of Psoriasis; Hyperlipidemia; Hypothyroidism; Anemia; PONV (postoperative nausea and vomiting); Spinal headache; Hypertension; Joint swelling; Heart murmur; Cancer ([place]); Diverticulosis; and Urinary frequency.  Clinical Impression   Pt admitted with above diagnosis. Pt currently with functional limitations due to the deficits listed below (see PT Problem List).  Pt will benefit from skilled PT to increase their independence and safety with mobility to allow discharge to the venue listed below.    She is moving very well with overall good knee control; I anticipate dc home tomorrow     Follow Up Recommendations Home health PT;Supervision - Intermittent    Equipment Recommendations  Rolling walker with 5" wheels;3in1 (PT)    Recommendations for Other Services OT consult     Precautions / Restrictions Precautions Precautions: Knee Precaution Booklet Issued: No Precaution Comments: Pt educated to not allow any pillow or bolster under knee for healing with optimal range of motion.  Restrictions Weight Bearing Restrictions: Yes RLE Weight Bearing: Weight bearing as tolerated      Mobility  Bed Mobility Overal bed mobility: Needs Assistance Bed Mobility: Supine to Sit     Supine to sit: Min guard     General bed mobility comments: Cues for technique; used bed rail -- not needing physical assist  Transfers Overall transfer level: Needs assistance Equipment used: Rolling walker (2 wheeled) Transfers: Sit to/from Stand Sit to Stand: Min guard         General transfer comment: Cues fro technique, safety, and hand placement; good rise   Ambulation/Gait Ambulation/Gait assistance: Min guard Ambulation Distance (Feet): 65 Feet Assistive device: Rolling walker (2  wheeled) Gait Pattern/deviations: Step-through pattern (emerging)     General Gait Details: Cues for gait sequence and to activate R quad for stance stability  Stairs            Wheelchair Mobility    Modified Rankin (Stroke Patients Only)       Balance                                             Pertinent Vitals/Pain Pain Assessment: 0-10 Pain Score: 7  Pain Location: R knee Pain Descriptors / Indicators: Aching Pain Intervention(s): Monitored during session;Repositioned    Home Living Family/patient expects to be discharged to:: Private residence Living Arrangements: Spouse/significant other Available Help at Discharge: Family;Available 24 hours/day Type of Home: House Home Access: Stairs to enter Entrance Stairs-Rails: None Entrance Stairs-Number of Steps: 3 Home Layout: One level        Prior Function Level of Independence: Independent               Hand Dominance        Extremity/Trunk Assessment   Upper Extremity Assessment: Overall WFL for tasks assessed           Lower Extremity Assessment: RLE deficits/detail RLE Deficits / Details: Grossly decr AROM and strength, limited by pain       Communication   Communication: No difficulties  Cognition Arousal/Alertness: Awake/alert Behavior During Therapy: WFL for tasks assessed/performed Overall Cognitive Status: Within Functional Limits for tasks assessed  General Comments      Exercises Total Joint Exercises Quad Sets: AROM;Right;10 reps Heel Slides: AAROM;Right;10 reps Goniometric ROM: approx 0-80 deg      Assessment/Plan    PT Assessment Patient needs continued PT services  PT Diagnosis Difficulty walking;Acute pain   PT Problem List Decreased strength;Decreased range of motion;Decreased activity tolerance;Decreased balance;Decreased mobility;Decreased knowledge of use of DME;Decreased knowledge of precautions;Pain  PT  Treatment Interventions DME instruction;Gait training;Stair training;Functional mobility training;Therapeutic activities;Therapeutic exercise;Patient/family education   PT Goals (Current goals can be found in the Care Plan section) Acute Rehab PT Goals Patient Stated Goal: walk without pain PT Goal Formulation: With patient Time For Goal Achievement: 06/18/15 Potential to Achieve Goals: Good    Frequency 7X/week   Barriers to discharge        Co-evaluation               End of Session Equipment Utilized During Treatment: Gait belt;Right knee immobilizer Activity Tolerance: Patient tolerated treatment well Patient left: in chair;with call bell/phone within reach;with family/visitor present Nurse Communication: Mobility status         Time: 1347-1411 PT Time Calculation (min) (ACUTE ONLY): 24 min   Charges:   PT Evaluation $PT Eval Moderate Complexity: 1 Procedure PT Treatments $Gait Training: 8-22 mins   PT G Codes:        Quin Hoop 06/11/2015, 2:50 PM  Roney Marion, [place] Pager [phone] Office [phone]

## 2015-06-12 ENCOUNTER — Encounter (HOSPITAL_COMMUNITY): Payer: Self-pay | Admitting: Orthopaedic Surgery

## 2015-06-12 NOTE — Progress Notes (Signed)
Physical Therapy Treatment Patient Details Name: Melissa Henry MRN: [id number] DOB: 1950-06-11 Today's Date: 06/12/2015    History of Present Illness Admitted for RTKA;  has a past medical history of Psoriasis; Hyperlipidemia; Hypothyroidism; Anemia; PONV (postoperative nausea and vomiting); Spinal headache; Hypertension; Joint swelling; Heart murmur; Cancer ([place]); Diverticulosis; and Urinary frequency.    PT Comments    Session focused on therex; Pt in more pain today than yesterday, but motivated and participating well; Showing good knee extension control against gravity; On track for dc home tomorrow  Follow Up Recommendations  Home health PT;Supervision - Intermittent     Equipment Recommendations  Other (comment) (has RW at home, but youth-sized RW is optimal fit)    Recommendations for Other Services       Precautions / Restrictions Precautions Precautions: Knee Precaution Comments: Pt educated to not allow any pillow or bolster under knee for healing with optimal range of motion.  Restrictions Weight Bearing Restrictions: Yes RLE Weight Bearing: Weight bearing as tolerated    Mobility  Bed Mobility Overal bed mobility: Needs Assistance Bed Mobility: Sit to Supine       Sit to supine: Min guard   General bed mobility comments: able to lift RLE onto bed without physical assist  Transfers Overall transfer level: Needs assistance Equipment used: Rolling walker (2 wheeled) Transfers: Sit to/from Stand Sit to Stand: Supervision         General transfer comment: good hand placement and rise  Ambulation/Gait Ambulation/Gait assistance: Min guard Ambulation Distance (Feet): 15 Feet (ambulated to use bathroom) Assistive device: Rolling walker (2 wheeled) Gait Pattern/deviations: Step-through pattern     General Gait Details: cued for R quad activation for stance stabiltiy before we began ambulation   Stairs            Wheelchair Mobility     Modified Rankin (Stroke Patients Only)       Balance                                    Cognition Arousal/Alertness: Awake/alert Behavior During Therapy: WFL for tasks assessed/performed Overall Cognitive Status: Within Functional Limits for tasks assessed                      Exercises Total Joint Exercises Quad Sets: AROM;10 reps;Right;Supine Short Arc Quad: AROM;Left;10 reps;Supine Heel Slides: AAROM;Right;10 reps;Supine Hip ABduction/ADduction: AROM;Right;10 reps;Supine Straight Leg Raises: AROM;10 reps;Supine;Right Long Arc Quad: Right;Seated;AAROM;10 reps Goniometric ROM: 62 deg    General Comments        Pertinent Vitals/Pain Pain Assessment: Faces Faces Pain Scale: Hurts even more Pain Location: Right knee, mainly with flexion therex Pain Descriptors / Indicators: Aching;Grimacing Pain Intervention(s): Monitored during session    Home Living                      Prior Function            PT Goals (current goals can now be found in the care plan section) Acute Rehab PT Goals Patient Stated Goal: home probably tomorrow Progress towards PT goals: Progressing toward goals    Frequency  7X/week    PT Plan Current plan remains appropriate    Co-evaluation             End of Session   Activity Tolerance: Patient tolerated treatment well Patient left: in bed;with call bell/phone within reach  Time: [id number] PT Time Calculation (min) (ACUTE ONLY): 25 min  Charges:  $Therapeutic Exercise: 8-22 mins $Therapeutic Activity: 8-22 mins                    G Codes:      Quin Hoop 06/12/2015, 5:05 PM  Roney Marion, [place] Pager [phone] Office [phone]

## 2015-06-12 NOTE — Progress Notes (Signed)
Subjective: 1 Day Post-Op Procedure(s) (LRB): TOTAL KNEE ARTHROPLASTY (Right)  Activity level:  wbat Diet tolerance:  ok Voiding:  ok Patient reports pain as mild and moderate.    Objective: Vital signs in last 24 hours: Temp:  [97 F (36.1 C)-98 F (36.7 C)] 97.8 F (36.6 C) (03/22 0500) Pulse Rate:  [52-71] 71 (03/22 0500) Resp:  [14-19] 18 (03/21 2159) BP: (118-132)/(59-92) 128/60 mmHg (03/22 0500) SpO2:  [96 %-100 %] 100 % (03/22 0500)  Labs: No results for input(s): HGB in the last 72 hours. No results for input(s): WBC, RBC, HCT, PLT in the last 72 hours. No results for input(s): NA, K, CL, CO2, BUN, CREATININE, GLUCOSE, CALCIUM in the last 72 hours. No results for input(s): LABPT, INR in the last 72 hours.  Physical Exam:  Neurologically intact ABD soft Neurovascular intact Sensation intact distally Intact pulses distally Dorsiflexion/Plantar flexion intact Incision: dressing C/D/I and no drainage No cellulitis present Compartment soft  Assessment/Plan:  1 Day Post-Op Procedure(s) (LRB): TOTAL KNEE ARTHROPLASTY (Right) Advance diet Up with therapy D/C IV fluids Plan for discharge tomorrow Discharge home with home health tomorrow if doing well and cleared by PT. I will change dressing tom aquacel tomorrow. Follow up in office 2 weeks post op. Continue on ASA 325mg  BID x 2 weeks post op.   Philip Kotlyar, [place] Sachs 06/12/2015, 7:56 AM

## 2015-06-12 NOTE — Progress Notes (Signed)
Occupational Therapy Treatment and Discharge Patient Details Name: Melissa Henry MRN: [id number] DOB: Jan 04, 1951 Today's Date: 06/12/2015    History of present illness Admitted for RTKA;  has a past medical history of Psoriasis; Hyperlipidemia; Hypothyroidism; Anemia; PONV (postoperative nausea and vomiting); Spinal headache; Hypertension; Joint swelling; Heart murmur; Cancer ([place]); Diverticulosis; and Urinary frequency.   OT comments  This 65 yo female seen today for tub transfer with 3n1. All education completed and we will sign off.  Follow Up Recommendations  No OT follow up    Equipment Recommendations  None recommended by OT       Precautions / Restrictions Precautions Precautions: Knee Restrictions Weight Bearing Restrictions: No RLE Weight Bearing: Weight bearing as tolerated       Mobility Bed Mobility               General bed mobility comments: Pt up in recliner upon arrival  Transfers Overall transfer level: Needs assistance Equipment used: Rolling walker (2 wheeled) Transfers: Sit to/from Stand Sit to Stand: Supervision                  ADL Overall ADL's : Needs assistance/impaired                                 Tub/ Shower Transfer: Minimal assistance (for LLE) Tub/Shower Transfer Details (indicate cue type and reason): Educated pt and husband on transfer with husband A'ing with RLE as pt went from sit<>stand onto 3-n-1 in tub.                     Cognition   Behavior During Therapy: WFL for tasks assessed/performed Overall Cognitive Status: Within Functional Limits for tasks assessed                                    Pertinent Vitals/ Pain       Pain Assessment: Faces Faces Pain Scale: Hurts little more Pain Location: right knee Pain Descriptors / Indicators: Aching;Sore Pain Intervention(s): Monitored during session;Repositioned         Frequency Min 2X/week     Progress Toward  Goals  OT Goals(current goals can now be found in the care plan section)  Progress towards OT goals: Goals met/education completed, patient discharged from OT  ADL Goals Pt Will Perform Tub/Shower Transfer: Tub transfer;with min assist;ambulating;rolling walker;3 in 1  Plan All goals met and education completed, patient discharged from OT services       End of Session Equipment Utilized During Treatment: Rolling walker;Gait belt   Activity Tolerance Patient tolerated treatment well   Patient Left in chair (in gym getting ready to work with PT)   Nurse Communication          Time: 8115-7262 OT Time Calculation (min): 18 min  Charges: OT General Charges $OT Visit: 1 Procedure OT Treatments $Self Care/Home Management : 8-22 mins  Almon Register [phone] 06/12/2015, 11:47 AM

## 2015-06-12 NOTE — Progress Notes (Signed)
Physical Therapy Treatment Patient Details Name: ALECIA VANDERWOUDE MRN: [id number] DOB: 01-25-1951 Today's Date: 06/12/2015    History of Present Illness Admitted for RTKA;  has a past medical history of Psoriasis; Hyperlipidemia; Hypothyroidism; Anemia; PONV (postoperative nausea and vomiting); Spinal headache; Hypertension; Joint swelling; Heart murmur; Cancer ([place]); Diverticulosis; and Urinary frequency.    PT Comments    Very good progress with mobiltiy and progressive amb; Stair training complete; Nice, stable knee in stance, no need for KI; will focus on therex next session  Follow Up Recommendations  Home health PT;Supervision - Intermittent     Equipment Recommendations  Other (comment) (has RW at home, but youth-sized RW is optimal fit)    Recommendations for Other Services       Precautions / Restrictions Precautions Precautions: Knee Precaution Comments: Pt educated to not allow any pillow or bolster under knee for healing with optimal range of motion.  Restrictions Weight Bearing Restrictions: No RLE Weight Bearing: Weight bearing as tolerated    Mobility  Bed Mobility               General bed mobility comments: Pt up in recliner upon arrival  Transfers Overall transfer level: Needs assistance Equipment used: Rolling walker (2 wheeled) Transfers: Sit to/from Stand Sit to Stand: Supervision         General transfer comment: VCs for safe hand placement  Ambulation/Gait Ambulation/Gait assistance: Min guard Ambulation Distance (Feet): 250 Feet Assistive device: Rolling walker (2 wheeled) Gait Pattern/deviations: Step-through pattern     General Gait Details: Cues for gait sequence and to activate R quad for stance stability   Stairs Stairs: Yes Stairs assistance: Min assist Stair Management: No rails;With walker;Backwards;Step to pattern Number of Stairs: 2 General stair comments: verbal and demo cues for technique; Husband present and gave  correct assist; managed steps well  Wheelchair Mobility    Modified Rankin (Stroke Patients Only)       Balance                                    Cognition Arousal/Alertness: Awake/alert Behavior During Therapy: WFL for tasks assessed/performed Overall Cognitive Status: Within Functional Limits for tasks assessed                      Exercises      General Comments        Pertinent Vitals/Pain Pain Assessment: 0-10 Pain Score: 7  Faces Pain Scale: Hurts little more Pain Location: Right knee Pain Descriptors / Indicators: Aching;Sore Pain Intervention(s): Monitored during session;Premedicated before session    Home Living                      Prior Function            PT Goals (current goals can now be found in the care plan section) Acute Rehab PT Goals Patient Stated Goal: home probably tomorrow PT Goal Formulation: With patient Time For Goal Achievement: 06/18/15 Potential to Achieve Goals: Good Progress towards PT goals: Progressing toward goals    Frequency  7X/week    PT Plan Current plan remains appropriate    Co-evaluation             End of Session Equipment Utilized During Treatment: Gait belt Activity Tolerance: Patient tolerated treatment well Patient left: in chair;with call bell/phone within reach;with family/visitor present  Time: [id number] PT Time Calculation (min) (ACUTE ONLY): 34 min  Charges:  $Gait Training: 23-37 mins                    G Codes:      Roney Marion Hamff 06/12/2015, 1:25 PM  Roney Marion, [place] Pager [phone] Office [phone]

## 2015-06-12 NOTE — Progress Notes (Signed)
Orthopedic Tech Progress Note Patient Details:  Melissa Henry [birth year] [id number] Ortho visit on cpm at 61 Patient ID: Melissa Henry, female   DOB: 11/21/1950, 65 y.o.   MRN: [id number]   Braulio Bosch 06/12/2015, 6:54 PM

## 2015-06-13 MED ORDER — METHOCARBAMOL 500 MG PO TABS: 500.0000 mg | ORAL_TABLET | Freq: Four times a day (QID) | ORAL | Status: AC | PRN

## 2015-06-13 MED ORDER — HYDROCODONE-ACETAMINOPHEN 5-325 MG PO TABS
1.0000 | ORAL_TABLET | ORAL | Status: AC | PRN
Start: 2015-06-13 — End: ?

## 2015-06-13 MED ORDER — ASPIRIN 325 MG PO TBEC: 325.0000 mg | DELAYED_RELEASE_TABLET | Freq: Two times a day (BID) | ORAL | Status: AC

## 2015-06-13 NOTE — Progress Notes (Signed)
Physical Therapy Treatment Patient Details Name: Melissa Henry MRN: [id number] DOB: August 29, 1950 Today's Date: 06/13/2015    History of Present Illness Admitted for RTKA;  has a past medical history of Psoriasis; Hyperlipidemia; Hypothyroidism; Anemia; PONV (postoperative nausea and vomiting); Spinal headache; Hypertension; Joint swelling; Heart murmur; Cancer ([place]); Diverticulosis; and Urinary frequency.    PT Comments    Pt with good progression of gait and HEP despite pain. Pain medicine requested and pt able to state safety with transfers and mobility. Pt with foam roll under heel end of session. Pt has met goals for D/C.   Follow Up Recommendations  Home health PT;Supervision - Intermittent     Equipment Recommendations       Recommendations for Other Services       Precautions / Restrictions Precautions Precautions: Knee Restrictions RLE Weight Bearing: Weight bearing as tolerated    Mobility  Bed Mobility Overal bed mobility: Needs Assistance Bed Mobility: Supine to Sit     Supine to sit: Supervision     General bed mobility comments: cues for sequence with assist of LLE to assist RLE  Transfers Overall transfer level: Needs assistance   Transfers: Sit to/from Stand Sit to Stand: Supervision         General transfer comment: cues for RLe position  Ambulation/Gait Ambulation/Gait assistance: Min guard Ambulation Distance (Feet): 150 Feet Assistive device: Rolling walker (2 wheeled) Gait Pattern/deviations: Step-to pattern;Decreased stride length;Decreased stance time - right   Gait velocity interpretation: Below normal speed for age/gender General Gait Details: cues for step through pattern and increased dorsiflexion RLE   Stairs            Wheelchair Mobility    Modified Rankin (Stroke Patients Only)       Balance                                    Cognition Arousal/Alertness: Awake/alert Behavior During Therapy:  WFL for tasks assessed/performed Overall Cognitive Status: Within Functional Limits for tasks assessed                      Exercises Total Joint Exercises Quad Sets: AROM;Right;10 reps;Supine Short Arc Quad: Right;10 reps;AAROM;Seated Heel Slides: AAROM;Right;15 reps;Supine Straight Leg Raises: AROM;Right;10 reps;Supine Goniometric ROM: 5-55    General Comments        Pertinent Vitals/Pain Pain Score: 8  Pain Location: right knee with standing Pain Intervention(s): Limited activity within patient's tolerance;Premedicated before session;Monitored during session;Patient requesting pain meds-RN notified    Home Living                      Prior Function            PT Goals (current goals can now be found in the care plan section) Progress towards PT goals: Progressing toward goals    Frequency       PT Plan Current plan remains appropriate    Co-evaluation             End of Session   Activity Tolerance: Patient tolerated treatment well Patient left: in chair;with call bell/phone within reach     Time: 0729-0803 PT Time Calculation (min) (ACUTE ONLY): 34 min  Charges:  $Gait Training: 8-22 mins $Therapeutic Exercise: 8-22 mins                    G Codes:  Lanetta Inch Beth 06/13/2015, 8:43 AM Elwyn Reach, [place]

## 2015-06-13 NOTE — Progress Notes (Signed)
Orthopedic Tech Progress Note Patient Details:  ICIS STRATIS 08/27/1950 [id number]  Patient ID: Melissa Henry, female   DOB: 07/10/1950, 65 y.o.   MRN: [id number] Applied cpm 0-64. That's where it already was.  Karolee Stamps 06/13/2015, 5:46 AM

## 2015-06-13 NOTE — Progress Notes (Signed)
[name] to be D/C'd Home per MD order. Discussed with the patient and all questions fully answered.    Medication List    STOP taking these medications        aspirin 81 MG tablet  Replaced by:  aspirin 325 MG EC tablet     methotrexate 2.5 MG tablet  Commonly known as:  RHEUMATREX      TAKE these medications        aspirin 325 MG EC tablet  Take 1 tablet (325 mg total) by mouth 2 (two) times daily after a meal.     atorvastatin 10 MG tablet  Commonly known as:  LIPITOR  Take 10 mg by mouth daily.     calcium carbonate 1250 (500 Ca) MG tablet  Commonly known as:  OS-CAL - dosed in mg of elemental calcium  Take 1 tablet by mouth daily.     cholecalciferol 1000 units tablet  Commonly known as:  VITAMIN D  Take 2,000 Units by mouth daily.     diltiazem 240 MG 24 hr capsule  Commonly known as:  TIAZAC  TAKE 1 CAPSULE EVERY MORNING ON AN EMPTY STOMACH     ferrous sulfate 325 (65 FE) MG tablet  Take 325 mg by mouth daily with supper.     folic acid 1 MG tablet  Commonly known as:  FOLVITE  Take 1 mg by mouth every Friday.     hydrochlorothiazide 25 MG tablet  Commonly known as:  HYDRODIURIL  Take 25 mg by mouth daily.     HYDROcodone-acetaminophen 5-325 MG tablet  Commonly known as:  NORCO/VICODIN  Take 1-2 tablets by mouth every 4 (four) hours as needed (breakthrough pain).     levothyroxine 100 MCG tablet  Commonly known as:  SYNTHROID, LEVOTHROID  Take 100 mcg by mouth daily.     methocarbamol 500 MG tablet  Commonly known as:  ROBAXIN  Take 1 tablet (500 mg total) by mouth every 6 (six) hours as needed for muscle spasms.     multivitamin with minerals Tabs tablet  Take 1 tablet by mouth daily.        VVS, Skin clean, dry and intact without evidence of skin break down, no evidence of skin tears noted.  IV catheter discontinued intact. Site without signs and symptoms of complications. Dressing and pressure applied.  An After Visit Summary was  printed and given to the patient.  Patient escorted via [place], and D/C home via private auto.  Cyndra Numbers  06/13/2015 2:53 PM

## 2015-06-13 NOTE — Progress Notes (Signed)
Physical Therapy Progress Note Pt continues to improve with gait and transfers and educated for HEP to maximize knee ROM with flexion and extension. Remains safe for D/C home   06/13/15 1100  PT Visit Information  Last PT Received On 06/13/15  Assistance Needed +1  History of Present Illness Admitted for RTKA;  has a past medical history of Psoriasis; Hyperlipidemia; Hypothyroidism; Anemia; PONV (postoperative nausea and vomiting); Spinal headache; Hypertension; Joint swelling; Heart murmur; Cancer ([place]); Diverticulosis; and Urinary frequency.  PT Time Calculation  PT Start Time (ACUTE ONLY) 1148  PT Stop Time (ACUTE ONLY) 1212  PT Time Calculation (min) (ACUTE ONLY) 24 min  Precautions  Precautions Knee  Restrictions  RLE Weight Bearing WBAT  Pain Assessment  Pain Score 6  Pain Location right knee  Pain Intervention(s) Limited activity within patient's tolerance;Repositioned;Patient requesting pain meds-RN notified  Cognition  Arousal/Alertness Awake/alert  Behavior During Therapy WFL for tasks assessed/performed  Overall Cognitive Status Within Functional Limits for tasks assessed  Bed Mobility  General bed mobility comments in chair on arrival and end of session  Transfers  Overall transfer level Needs assistance  Transfers Sit to/from Stand  Sit to Stand Supervision  General transfer comment cues for RLe position  Ambulation/Gait  Ambulation/Gait assistance Supervision  Ambulation Distance (Feet) 250 Feet  Assistive device Rolling walker (2 wheeled)  Gait Pattern/deviations Step-through pattern;Decreased stride length;Trunk flexed  General Gait Details cues for increased dorsiflexion, trunk extension and increased stride  Gait velocity interpretation Below normal speed for age/gender  Total Joint Exercises  Heel Slides AAROM;Right;15 reps;Seated  Short Arc Quad Right;15 reps;AAROM;Seated  Straight Leg Raises 15 reps;AROM;Right;Seated  PT - End of Session  Activity  Tolerance Patient tolerated treatment well  Patient left in chair;with call bell/phone within reach;with family/visitor present  Nurse Communication Mobility status  PT - Assessment/Plan  PT Plan Current plan remains appropriate  Follow Up Recommendations Home health PT;Supervision - Intermittent  PT Goal Progression  Progress towards PT goals Progressing toward goals  PT General Charges  $$ ACUTE PT VISIT 1 Procedure  PT Treatments  $Gait Training 8-22 mins  $Therapeutic Exercise 8-22 mins  Elwyn Reach, [place]

## 2015-06-13 NOTE — Discharge Summary (Signed)
Patient ID: Melissa Henry MRN: [id number] DOB/AGE: 05-10-1950 64 y.o.  Admit date: 06/11/2015 Discharge date: 06/13/2015  Admission Diagnoses:  Principal Problem:   Primary osteoarthritis of right knee   Discharge Diagnoses:  Same  Past Medical History  Diagnosis Date  . Psoriasis     takes Methotrexate daily  . Hyperlipidemia     takes Atorvastatin daily  . Hypothyroidism     takes Synthroid daily  . Anemia     takes Ferrous Sulfate daily  . PONV (postoperative nausea and vomiting)   . Spinal headache     did not have a blood patch  . Hypertension     takes Diltiazem and HCTZ daily  . Joint swelling   . Heart murmur     diagnosed 20+ yrs ago  . History of bronchitis 2+ yrs ago  . Joint pain   . Cancer (HCC)     renal cell carcinoma  . Diverticulosis   . Urinary frequency     Surgeries: Procedure(s): TOTAL KNEE ARTHROPLASTY on 06/11/2015   Consultants:    Discharged Condition: Improved  Hospital Course: Melissa Henry is an 65 y.o. female who was admitted 06/11/2015 for operative treatment ofPrimary osteoarthritis of right knee. Patient has severe unremitting pain that affects sleep, daily activities, and work/hobbies. After pre-op clearance the patient was taken to the operating room on 06/11/2015 and underwent  Procedure(s): TOTAL KNEE ARTHROPLASTY.    Patient was given perioperative antibiotics: Anti-infectives    Start     Dose/Rate Route Frequency Ordered Stop   06/11/15 1300  ceFAZolin (ANCEF) IVPB 2 g/50 mL premix     2 g 100 mL/hr over 30 Minutes Intravenous Every 6 hours 06/11/15 1049 06/11/15 2047   06/11/15 0700  ceFAZolin (ANCEF) IVPB 2 g/50 mL premix     2 g 100 mL/hr over 30 Minutes Intravenous To [facility] 06/10/15 1104 06/11/15 0729       Patient was given sequential compression devices, early ambulation, and chemoprophylaxis to prevent DVT.  Patient benefited maximally from hospital stay and there were no complications.     Recent vital signs: Patient Vitals for the past 24 hrs:  BP Temp Temp src Pulse Resp SpO2  06/13/15 0518 132/68 mmHg 99.2 F (37.3 C) Oral 73 16 95 %  06/12/15 2149 (!) 167/59 mmHg 97.6 F (36.4 C) Oral 63 18 97 %     Recent laboratory studies: No results for input(s): WBC, HGB, HCT, PLT, NA, K, CL, CO2, BUN, CREATININE, GLUCOSE, INR, CALCIUM in the last 72 hours.  Invalid input(s): PT, 2   Discharge Medications:     Medication List    STOP taking these medications        aspirin 81 MG tablet  Replaced by:  aspirin 325 MG EC tablet     methotrexate 2.5 MG tablet  Commonly known as:  RHEUMATREX      TAKE these medications        aspirin 325 MG EC tablet  Take 1 tablet (325 mg total) by mouth 2 (two) times daily after a meal.     atorvastatin 10 MG tablet  Commonly known as:  LIPITOR  Take 10 mg by mouth daily.     calcium carbonate 1250 (500 Ca) MG tablet  Commonly known as:  OS-CAL - dosed in mg of elemental calcium  Take 1 tablet by mouth daily.     cholecalciferol 1000 units tablet  Commonly known as:  VITAMIN D  Take 2,000  Units by mouth daily.     diltiazem 240 MG 24 hr capsule  Commonly known as:  TIAZAC  TAKE 1 CAPSULE EVERY MORNING ON AN EMPTY STOMACH     ferrous sulfate 325 (65 FE) MG tablet  Take 325 mg by mouth daily with supper.     folic acid 1 MG tablet  Commonly known as:  FOLVITE  Take 1 mg by mouth every Friday.     hydrochlorothiazide 25 MG tablet  Commonly known as:  HYDRODIURIL  Take 25 mg by mouth daily.     HYDROcodone-acetaminophen 5-325 MG tablet  Commonly known as:  NORCO/VICODIN  Take 1-2 tablets by mouth every 4 (four) hours as needed (breakthrough pain).     levothyroxine 100 MCG tablet  Commonly known as:  SYNTHROID, LEVOTHROID  Take 100 mcg by mouth daily.     methocarbamol 500 MG tablet  Commonly known as:  ROBAXIN  Take 1 tablet (500 mg total) by mouth every 6 (six) hours as needed for muscle spasms.      multivitamin with minerals Tabs tablet  Take 1 tablet by mouth daily.        Diagnostic Studies: No results found.  Disposition: 01-Home or Self Care      Discharge Instructions    Call MD / Call 911    Complete by:  As directed   If you experience chest pain or shortness of breath, CALL 911 and be transported to the hospital emergency room.  If you develope a fever above 101 F, pus (white drainage) or increased drainage or redness at the wound, or calf pain, call your surgeon's office.     Constipation Prevention    Complete by:  As directed   Drink plenty of fluids.  Prune juice may be helpful.  You may use a stool softener, such as Colace (over the counter) 100 mg twice a day.  Use MiraLax (over the counter) for constipation as needed.     Diet - low sodium heart healthy    Complete by:  As directed      Discharge instructions    Complete by:  As directed   INSTRUCTIONS AFTER JOINT REPLACEMENT   Remove items at home which could result in a fall. This includes throw rugs or furniture in walking pathways ICE to the affected joint every three hours while awake for 30 minutes at a time, for at least the first 3-5 days, and then as needed for pain and swelling.  Continue to use ice for pain and swelling. You may notice swelling that will progress down to the foot and ankle.  This is normal after surgery.  Elevate your leg when you are not up walking on it.   Continue to use the breathing machine you got in the hospital (incentive spirometer) which will help keep your temperature down.  It is common for your temperature to cycle up and down following surgery, especially at night when you are not up moving around and exerting yourself.  The breathing machine keeps your lungs expanded and your temperature down.   DIET:  As you were doing prior to hospitalization, we recommend a well-balanced diet.  DRESSING / WOUND CARE / SHOWERING  You may shower 3 days after surgery, but keep the wounds  dry during showering.  You may use an occlusive plastic wrap (Press'n Seal for example), NO SOAKING/SUBMERGING IN THE BATHTUB.  If the bandage gets wet, change with a clean dry gauze.  If the incision  gets wet, pat the wound dry with a clean towel.  ACTIVITY  Increase activity slowly as tolerated, but follow the weight bearing instructions below.   No driving for 6 weeks or until further direction given by your physician.  You cannot drive while taking narcotics.  No lifting or carrying greater than 10 lbs. until further directed by your surgeon. Avoid periods of inactivity such as sitting longer than an hour when not asleep. This helps prevent blood clots.  You may return to work once you are authorized by your doctor.     WEIGHT BEARING   Weight bearing as tolerated with assist device (walker, cane, etc) as directed, use it as long as suggested by your surgeon or therapist, typically at least 4-6 weeks.   EXERCISES  Results after joint replacement surgery are often greatly improved when you follow the exercise, range of motion and muscle strengthening exercises prescribed by your doctor. Safety measures are also important to protect the joint from further injury. Any time any of these exercises cause you to have increased pain or swelling, decrease what you are doing until you are comfortable again and then slowly increase them. If you have problems or questions, call your caregiver or physical therapist for advice.   Rehabilitation is important following a joint replacement. After just a few days of immobilization, the muscles of the leg can become weakened and shrink (atrophy).  These exercises are designed to build up the tone and strength of the thigh and leg muscles and to improve motion. Often times heat used for twenty to thirty minutes before working out will loosen up your tissues and help with improving the range of motion but do not use heat for the first two weeks following surgery  (sometimes heat can increase post-operative swelling).   These exercises can be done on a training (exercise) mat, on the floor, on a table or on a bed. Use whatever works the best and is most comfortable for you.    Use music or television while you are exercising so that the exercises are a pleasant break in your day. This will make your life better with the exercises acting as a break in your routine that you can look forward to.   Perform all exercises about fifteen times, three times per day or as directed.  You should exercise both the operative leg and the other leg as well.   Exercises include:   Quad Sets - Tighten up the muscle on the front of the thigh (Quad) and hold for 5-10 seconds.   Straight Leg Raises - With your knee straight (if you were given a brace, keep it on), lift the leg to 60 degrees, hold for 3 seconds, and slowly lower the leg.  Perform this exercise against resistance later as your leg gets stronger.  Leg Slides: Lying on your back, slowly slide your foot toward your buttocks, bending your knee up off the floor (only go as far as is comfortable). Then slowly slide your foot back down until your leg is flat on the floor again.  Angel Wings: Lying on your back spread your legs to the side as far apart as you can without causing discomfort.  Hamstring Strength:  Lying on your back, push your heel against the floor with your leg straight by tightening up the muscles of your buttocks.  Repeat, but this time bend your knee to a comfortable angle, and push your heel against the floor.  You may put a  pillow under the heel to make it more comfortable if necessary.   A rehabilitation program following joint replacement surgery can speed recovery and prevent re-injury in the future due to weakened muscles. Contact your doctor or a physical therapist for more information on knee rehabilitation.    CONSTIPATION  Constipation is defined medically as fewer than three stools per week  and severe constipation as less than one stool per week.  Even if you have a regular bowel pattern at home, your normal regimen is likely to be disrupted due to multiple reasons following surgery.  Combination of anesthesia, postoperative narcotics, change in appetite and fluid intake all can affect your bowels.   YOU MUST use at least one of the following options; they are listed in order of increasing strength to get the job done.  They are all available over the counter, and you may need to use some, POSSIBLY even all of these options:    Drink plenty of fluids (prune juice may be helpful) and high fiber foods Colace 100 mg by mouth twice a day  Senokot for constipation as directed and as needed Dulcolax (bisacodyl), take with full glass of water  Miralax (polyethylene glycol) once or twice a day as needed.  If you have tried all these things and are unable to have a bowel movement in the first 3-4 days after surgery call either your surgeon or your primary doctor.    If you experience loose stools or diarrhea, hold the medications until you stool forms back up.  If your symptoms do not get better within 1 week or if they get worse, check with your doctor.  If you experience "the worst abdominal pain ever" or develop nausea or vomiting, please contact the office immediately for further recommendations for treatment.   ITCHING:  If you experience itching with your medications, try taking only a single pain pill, or even half a pain pill at a time.  You can also use Benadryl over the counter for itching or also to help with sleep.   TED HOSE STOCKINGS:  Use stockings on both legs until for at least 2 weeks or as directed by physician office. They may be removed at night for sleeping.  MEDICATIONS:  See your medication summary on the "After Visit Summary" that nursing will review with you.  You may have some home medications which will be placed on hold until you complete the course of blood  thinner medication.  It is important for you to complete the blood thinner medication as prescribed.  PRECAUTIONS:  If you experience chest pain or shortness of breath - call 911 immediately for transfer to the hospital emergency department.   If you develop a fever greater that 101 F, purulent drainage from wound, increased redness or drainage from wound, foul odor from the wound/dressing, or calf pain - CONTACT YOUR SURGEON.                                                   FOLLOW-UP APPOINTMENTS:  If you do not already have a post-op appointment, please call the office for an appointment to be seen by your surgeon.  Guidelines for how soon to be seen are listed in your "After Visit Summary", but are typically between 1-4 weeks after surgery.  OTHER INSTRUCTIONS:   Knee Replacement:  Do  not place pillow under knee, focus on keeping the knee straight while resting. CPM instructions: 0-90 degrees, 2 hours in the morning, 2 hours in the afternoon, and 2 hours in the evening. Place foam block, curve side up under heel at all times except when in CPM or when walking.  DO NOT modify, tear, cut, or change the foam block in any way.  MAKE SURE YOU:  Understand these instructions.  Get help right away if you are not doing well or get worse.    Thank you for letting us be a part of your medical care team.  It is a privilege we respect greatly.  We hope these instructions will help you stay on track for a fast and full recovery!     Increase activity slowly as tolerated    Complete by:  As directed            Follow-up Information    Follow up with Hessie Dibble, MD. Schedule an appointment as soon as possible for a visit in 2 weeks.   Specialty:  Orthopedic Surgery   Contact information:   [place] [address] [phone]        Signed: Rich Fuchs 06/13/2015, 2:14 PM

## 2015-10-01 ENCOUNTER — Other Ambulatory Visit: Payer: Self-pay | Admitting: Internal Medicine

## 2015-10-01 DIAGNOSIS — Z1231 Encounter for screening mammogram for malignant neoplasm of breast: Secondary | ICD-10-CM

## 2015-10-22 ENCOUNTER — Ambulatory Visit: Payer: Medicare Other

## 2015-10-22 DEATH — deceased
